# Patient Record
Sex: Male | Born: 1950 | Race: White | Hispanic: No | Marital: Married | Smoking: Never smoker
Health system: Southern US, Community
[De-identification: ages and names within clinical notes are randomized; demographics above are authoritative.]

## PROBLEM LIST (undated history)

## (undated) DIAGNOSIS — G4700 Insomnia, unspecified: Secondary | ICD-10-CM

## (undated) DIAGNOSIS — E119 Type 2 diabetes mellitus without complications: Secondary | ICD-10-CM

## (undated) HISTORY — PX: HERNIA REPAIR: SHX51

## (undated) HISTORY — DX: Type 2 diabetes mellitus without complications: E11.9

## (undated) HISTORY — DX: Insomnia, unspecified: G47.00

## (undated) HISTORY — PX: HAND SURGERY: SHX662

---

## 2005-08-26 ENCOUNTER — Encounter: Admission: RE | Admit: 2005-08-26 | Discharge: 2005-08-26 | Payer: Self-pay | Admitting: Orthopedic Surgery

## 2005-09-02 ENCOUNTER — Ambulatory Visit (HOSPITAL_COMMUNITY): Admission: RE | Admit: 2005-09-02 | Discharge: 2005-09-02 | Payer: Self-pay | Admitting: Orthopedic Surgery

## 2005-09-24 ENCOUNTER — Encounter: Admission: RE | Admit: 2005-09-24 | Discharge: 2005-09-24 | Payer: Self-pay | Admitting: Orthopedic Surgery

## 2007-12-03 ENCOUNTER — Emergency Department (HOSPITAL_COMMUNITY): Admission: EM | Admit: 2007-12-03 | Discharge: 2007-12-03 | Payer: Self-pay | Admitting: Family Medicine

## 2007-12-11 ENCOUNTER — Ambulatory Visit: Payer: Self-pay

## 2007-12-11 ENCOUNTER — Ambulatory Visit: Payer: Self-pay | Admitting: Cardiology

## 2010-04-05 ENCOUNTER — Encounter: Payer: Self-pay | Admitting: Orthopedic Surgery

## 2010-07-20 DIAGNOSIS — G4700 Insomnia, unspecified: Secondary | ICD-10-CM

## 2010-07-20 DIAGNOSIS — M47812 Spondylosis without myelopathy or radiculopathy, cervical region: Secondary | ICD-10-CM

## 2010-07-20 DIAGNOSIS — N529 Male erectile dysfunction, unspecified: Secondary | ICD-10-CM

## 2010-07-20 HISTORY — DX: Male erectile dysfunction, unspecified: N52.9

## 2010-07-20 HISTORY — DX: Insomnia, unspecified: G47.00

## 2010-07-20 HISTORY — DX: Spondylosis without myelopathy or radiculopathy, cervical region: M47.812

## 2010-07-28 NOTE — Procedures (Signed)
[facility]                              Ronald Cruz, Ronald Cruz                        MRN:          [id number]  DATE:12/11/2007                            DOB:          1950/06/18    REFERRING PHYSICIAN:  Charlesetta Shanks   REFERRING PHYSICIAN:  Dr. Charlesetta Shanks   INDICATION FOR STRESS TEST:  The patient with hypertension, diabetes,  and hypercholesterolemia who wants to increase his level of Ronald.  This is a screening test for any evidence of coronary artery disease.   Ronald TEST SUMMARY:  The patient exercised using standard Bruce  protocol for 10 minutes and 8 seconds.  He achieved a work level of  11.90 METs.  The resting heart rate of 88 beats per minute, rest with  maximal heart rate of 176 beats per minute.  This value represents 108%  of the maximal age predicted heart rate.  The resting blood pressure of  121/77 mmHg, rest with maximum blood pressure of 184/71 mmHg.  The  Ronald test was stopped due to fatigue.  There was no chest pain.  There was no dyspnea.  Ronald EKG showed nonspecific upsloping ST  depressions in leads V4 and V5.  There was one strip on which there was  about 1 mm of horizontal ST depression only in lead V4 and this was not  present on the following strip or on the strip before.  I think overall  this is a negative EKG treadmill test for ischemia.   INTERPRETATION:  There is no definite evidence for ischemia and  excellent Ronald capacity.  I would suggest continued aggressive risk  factor management with diabetes control, blood pressure control.  The  patient is to start taking aspirin 81 mg daily and should continue on  his cholesterol medication with a goal LDL less than 100.     Marca Ancona, MD  Electronically Signed    DM/MedQ  DD: 12/11/2007  DT: 12/12/2007  Job #: [id number]   cc:   Charlesetta Shanks

## 2011-08-18 DIAGNOSIS — E559 Vitamin D deficiency, unspecified: Secondary | ICD-10-CM | POA: Insufficient documentation

## 2011-08-18 HISTORY — DX: Vitamin D deficiency, unspecified: E55.9

## 2012-06-28 DIAGNOSIS — L247 Irritant contact dermatitis due to plants, except food: Secondary | ICD-10-CM

## 2012-06-28 HISTORY — DX: Irritant contact dermatitis due to plants, except food: L24.7

## 2012-08-19 DIAGNOSIS — I10 Essential (primary) hypertension: Secondary | ICD-10-CM

## 2012-08-19 DIAGNOSIS — J309 Allergic rhinitis, unspecified: Secondary | ICD-10-CM

## 2012-08-19 DIAGNOSIS — F32A Depression, unspecified: Secondary | ICD-10-CM

## 2012-08-19 DIAGNOSIS — F329 Major depressive disorder, single episode, unspecified: Secondary | ICD-10-CM

## 2012-08-19 HISTORY — DX: Depression, unspecified: F32.A

## 2012-08-19 HISTORY — DX: Major depressive disorder, single episode, unspecified: F32.9

## 2012-08-19 HISTORY — DX: Allergic rhinitis, unspecified: J30.9

## 2012-08-19 HISTORY — DX: Essential (primary) hypertension: I10

## 2013-05-02 DIAGNOSIS — B029 Zoster without complications: Secondary | ICD-10-CM

## 2013-05-02 DIAGNOSIS — E669 Obesity, unspecified: Secondary | ICD-10-CM | POA: Insufficient documentation

## 2013-05-02 DIAGNOSIS — E119 Type 2 diabetes mellitus without complications: Secondary | ICD-10-CM | POA: Insufficient documentation

## 2013-05-02 DIAGNOSIS — E782 Mixed hyperlipidemia: Secondary | ICD-10-CM

## 2013-05-02 DIAGNOSIS — Z135 Encounter for screening for eye and ear disorders: Secondary | ICD-10-CM

## 2013-05-02 HISTORY — DX: Obesity, unspecified: E66.9

## 2013-05-02 HISTORY — DX: Type 2 diabetes mellitus without complications: E11.9

## 2013-05-02 HISTORY — DX: Encounter for screening for eye and ear disorders: Z13.5

## 2013-05-02 HISTORY — DX: Zoster without complications: B02.9

## 2013-05-02 HISTORY — DX: Mixed hyperlipidemia: E78.2

## 2013-12-31 DIAGNOSIS — B351 Tinea unguium: Secondary | ICD-10-CM | POA: Insufficient documentation

## 2013-12-31 HISTORY — DX: Tinea unguium: B35.1

## 2014-09-25 DIAGNOSIS — M778 Other enthesopathies, not elsewhere classified: Secondary | ICD-10-CM

## 2014-09-25 HISTORY — DX: Other enthesopathies, not elsewhere classified: M77.8

## 2015-10-17 DIAGNOSIS — Z1211 Encounter for screening for malignant neoplasm of colon: Secondary | ICD-10-CM | POA: Insufficient documentation

## 2015-10-17 HISTORY — DX: Encounter for screening for malignant neoplasm of colon: Z12.11

## 2016-11-04 DIAGNOSIS — B354 Tinea corporis: Secondary | ICD-10-CM

## 2016-11-04 HISTORY — DX: Tinea corporis: B35.4

## 2017-03-30 DIAGNOSIS — M25511 Pain in right shoulder: Secondary | ICD-10-CM | POA: Insufficient documentation

## 2017-03-30 DIAGNOSIS — M1810 Unilateral primary osteoarthritis of first carpometacarpal joint, unspecified hand: Secondary | ICD-10-CM

## 2017-03-30 DIAGNOSIS — M25512 Pain in left shoulder: Secondary | ICD-10-CM | POA: Insufficient documentation

## 2017-03-30 DIAGNOSIS — M79642 Pain in left hand: Secondary | ICD-10-CM | POA: Insufficient documentation

## 2017-03-30 HISTORY — DX: Pain in left shoulder: M25.512

## 2017-03-30 HISTORY — DX: Pain in right shoulder: M25.511

## 2017-03-30 HISTORY — DX: Unilateral primary osteoarthritis of first carpometacarpal joint, unspecified hand: M18.10

## 2017-03-30 HISTORY — DX: Pain in left hand: M79.642

## 2017-11-23 DIAGNOSIS — M6530 Trigger finger, unspecified finger: Secondary | ICD-10-CM

## 2017-11-23 HISTORY — DX: Trigger finger, unspecified finger: M65.30

## 2019-04-16 ENCOUNTER — Ambulatory Visit: Payer: Self-pay

## 2019-05-04 ENCOUNTER — Ambulatory Visit: Payer: Self-pay

## 2019-05-06 ENCOUNTER — Ambulatory Visit: Payer: Medicare HMO | Attending: Internal Medicine

## 2019-05-06 DIAGNOSIS — Z23 Encounter for immunization: Secondary | ICD-10-CM | POA: Insufficient documentation

## 2019-05-06 NOTE — Progress Notes (Signed)
   Covid-19 Vaccination Clinic  Name:  Ronald Cruz    MRN: [id number] DOB: [birth year]  05/06/2019  Mr. Ronald Cruz was observed post Covid-19 immunization for 15 minutes without incidence. He was provided with Vaccine Information Sheet and instruction to access the V-Safe system.   Mr. Ronald Cruz was instructed to call 911 with any severe reactions post vaccine: Marland Kitchen Difficulty breathing  . Swelling of your face and throat  . A fast heartbeat  . A bad rash all over your body  . Dizziness and weakness    Immunizations Administered    Name Date Dose VIS Date Route   Pfizer COVID-19 Vaccine 05/06/2019  9:03 AM 0.3 mL 02/23/2019 Intramuscular   Manufacturer: [place]   Lot: [id number]   [place]: [id number]

## 2019-05-29 ENCOUNTER — Ambulatory Visit: Payer: Medicare HMO | Attending: Internal Medicine

## 2019-05-29 DIAGNOSIS — Z23 Encounter for immunization: Secondary | ICD-10-CM

## 2019-05-29 NOTE — Progress Notes (Signed)
   Covid-19 Vaccination Clinic  Name:  Ronald Cruz    MRN: [id number] DOB: 10-16-1950  05/29/2019  Ronald Cruz was observed post Covid-19 immunization for 15 minutes without incident. He was provided with Vaccine Information Sheet and instruction to access the V-Safe system.   Ronald Cruz was instructed to call 911 with any severe reactions post vaccine: Marland Kitchen Difficulty breathing  . Swelling of face and throat  . A fast heartbeat  . A bad rash all over body  . Dizziness and weakness   Immunizations Administered    Name Date Dose VIS Date Route   Pfizer COVID-19 Vaccine 05/29/2019  3:23 PM 0.3 mL 02/23/2019 Intramuscular   Manufacturer: [place]   Lot: [id number]   [place]: [id number]

## 2019-08-21 DIAGNOSIS — R413 Other amnesia: Secondary | ICD-10-CM | POA: Insufficient documentation

## 2019-08-21 HISTORY — DX: Other amnesia: R41.3

## 2019-08-28 DIAGNOSIS — R221 Localized swelling, mass and lump, neck: Secondary | ICD-10-CM

## 2019-08-28 DIAGNOSIS — M545 Low back pain, unspecified: Secondary | ICD-10-CM | POA: Insufficient documentation

## 2019-08-28 DIAGNOSIS — G8929 Other chronic pain: Secondary | ICD-10-CM

## 2019-08-28 HISTORY — DX: Low back pain, unspecified: M54.50

## 2019-08-28 HISTORY — DX: Localized swelling, mass and lump, neck: R22.1

## 2019-08-28 HISTORY — DX: Other chronic pain: G89.29

## 2019-10-15 DIAGNOSIS — K116 Mucocele of salivary gland: Secondary | ICD-10-CM

## 2019-10-15 HISTORY — DX: Mucocele of salivary gland: K11.6

## 2019-10-24 ENCOUNTER — Encounter: Payer: Self-pay | Admitting: Cardiology

## 2019-10-24 ENCOUNTER — Encounter: Payer: Self-pay | Admitting: *Deleted

## 2019-10-24 ENCOUNTER — Telehealth: Payer: Self-pay | Admitting: Cardiology

## 2019-10-24 ENCOUNTER — Other Ambulatory Visit: Payer: Self-pay

## 2019-10-24 ENCOUNTER — Ambulatory Visit: Payer: Medicare HMO | Admitting: Cardiology

## 2019-10-24 VITALS — BP 90/60 | HR 80 | Ht 69.5 in | Wt 207.0 lb

## 2019-10-24 DIAGNOSIS — E119 Type 2 diabetes mellitus without complications: Secondary | ICD-10-CM

## 2019-10-24 DIAGNOSIS — E785 Hyperlipidemia, unspecified: Secondary | ICD-10-CM

## 2019-10-24 DIAGNOSIS — I10 Essential (primary) hypertension: Secondary | ICD-10-CM | POA: Diagnosis not present

## 2019-10-24 DIAGNOSIS — I2510 Atherosclerotic heart disease of native coronary artery without angina pectoris: Secondary | ICD-10-CM

## 2019-10-24 DIAGNOSIS — E669 Obesity, unspecified: Secondary | ICD-10-CM

## 2019-10-24 HISTORY — DX: Obesity, unspecified: E66.9

## 2019-10-24 HISTORY — DX: Type 2 diabetes mellitus without complications: E11.9

## 2019-10-24 HISTORY — DX: Atherosclerotic heart disease of native coronary artery without angina pectoris: I25.10

## 2019-10-24 HISTORY — DX: Hyperlipidemia, unspecified: E78.5

## 2019-10-24 NOTE — Progress Notes (Signed)
Cardiology Office Note:    Date:  10/24/2019   ID:  Ronald Cruz, DOB June 22, 1950, MRN [id number]  PCP:  Bernerd Limbo, MD  Cardiologist:  No primary care provider on file.  Electrophysiologist:  None   Referring MD: Bernerd Limbo, MD   Chief Complaint  Patient presents with  . Hypertension    With diabetes   History of Present Illness:    Ronald Cruz is a 69 y.o. male with a hx of hypertension, hyperlipidemia, diabetes, history of subtle memory loss, presents today to be evaluated and establish cardiac care.  Patient does not have any specific cardiovascular complaints at this time.  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 In the office today he denies any chest pain, shortness of breath, nausea, vomiting, any dizziness or lightheadedness.   Past Medical History:  Diagnosis Date  . Acute pain of left shoulder 03/30/2017  . Allergic rhinitis 08/19/2012  . Cervical spondylosis 07/20/2010  . Chronic midline low back pain without sciatica 08/28/2019   Last Assessment & Plan:  Formatting of this note might be different from the original. This pain is chronic, but he does not take medication, nor have any radicular symptoms.  Because he can have multiple diagnostic imaging studies performed on the same day for a single co-pay, he desires MRI of the lumbar spine to be scheduled along with previously planned MRI of the brain.  MRI and plain films o  . Contact dermatitis and eczema due to plant 06/28/2012   Formatting of this note might be different from the original. Overview:  IMPRESSION: Topical itch creams advised also.  Steroid will affect blood sugar of next few days. Formatting of this note might be different from the  original. IMPRESSION: Topical itch creams advised also.  Steroid will affect blood sugar of next few days. Formatting of this note might be different from the original. Overview:  . Depressive disorder 08/19/2012  . 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 Overview:  IMPRESSION: Pt's Levitra has not worked. We will change to Viagra. Welbutrin usually does not affect erection. It could be more a result of his DM Formatting of this note might be different from the original. IMPRESSION: Pt's Levitra has not worked. We will change to Viagra. Welbutrin usually does not affect erection. It coul  . Essential hypertension 08/19/2012  . Herpes zoster 05/02/2013   Formatting of this note might be different from the original. Overview:  STORY: rash since sunday, post 72 hours window for antiviral therapy. `E1o3L`pain described is consistant with shingles, the rash is not vesicular, but has been scratching areas.`E1o3L`IMPRESSION: pain bearable. will watch and start neurontin if needed Formatting of this note might be different from the original. STORY: rash   . Lump in neck 08/28/2019   Formatting of this note might be different from the original. RIGHT, just posterior to the angle of the mandible.  Nontender.  Soft and mobile.  Last Assessment & Plan:  Formatting of this note might be different from the original. Right, just posterior to the angle of the mandible.  Nontender.  Soft and mobile.  This is  not concerning for neoplasm.  . Memory changes 08/21/2019  . Mixed hyperlipidemia 05/02/2013   Formatting of this note might be different from the original. Overview:  IMPRESSION: The goal is to have your total cholesterol < 200, the HDL (good cholesterol) >40, and the LDL (bad cholesterol) <100.`E1o3L`It is recomended that you follow a good low fat diet and exercise for 30 minutes 3-4 times a week. Formatting of this note might be different from the  original. IMPRESSION: The goal is to hav  . Obesity 05/02/2013  . Onychomycosis 12/31/2013  . 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 Pain of left hand 03/30/2017  . Pain of right shoulder joint on movement 03/30/2017  . 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 Tendinitis of left shoulder 09/25/2014  . Tinea corporis 11/04/2016  . Trigger finger 11/23/2017  . Type II diabetes mellitus ([place]) 05/02/2013   Formatting of this note might be different from the original. Overview:  IMPRESSION: The goal is for the Hgb A1C to be less than 7.0.`E1o3L`It is recommended that all diatetics are educated on and follow a healthy diabetic diet, exercise for 30 minutes 3-4 times per week (walking, biking, swimming, or machine), monitor blood glucose readings and bring that record with you to be reviewed at your ne  . Vitamin D deficiency 08/18/2011   Formatting of this note might be different from the original. Overview:  IMPRESSION: patient taking Vitamin D 2000IU per day. His levels are normal. Continue same dose Formatting of this note might be different from the original. IMPRESSION: patient taking Vitamin D 2000IU per day. His levels are normal. Continue same dose Formatting of this note might be different from the original. Overview:  Ov    Past Surgical History:  Procedure Laterality Date  . HAND SURGERY Right    Thumb  . HERNIA REPAIR      Current Medications: Current Meds  Medication Sig  . amLODipine (NORVASC) 10 MG tablet Take 10 mg by mouth daily.  . Ascorbic Acid (VITAMIN C) 1000 MG tablet Take 1,000 mg by mouth daily.  Marland Kitchen aspirin EC 81 MG tablet Take 81 mg by mouth daily. Swallow whole.  . benazepril (LOTENSIN) 20 MG tablet Take 20 mg by mouth daily.  . clotrimazole-betamethasone (LOTRISONE) cream Apply 1 application topically 2 (two) times daily.  . cyanocobalamin 1000 MCG tablet Take 1 tablet by mouth in the morning and at bedtime.  . Empagliflozin-linaGLIPtin (GLYXAMBI) 25-5 MG  TABS Take 1 tablet by mouth daily.  Marland Kitchen glipiZIDE (GLUCOTROL XL) 10 MG 24 hr tablet Take 10 mg by mouth in the morning and at bedtime.  . hydrochlorothiazide (HYDRODIURIL) 25 MG tablet Take 25 mg by mouth daily.  . metFORMIN (GLUCOPHAGE) 1000 MG tablet Take 1,000 mg by mouth 2 (two) times daily with a meal.  . simvastatin (ZOCOR) 40 MG tablet Take 40 mg by mouth daily.  . tadalafil (CIALIS) 5 MG tablet Take 5 mg by mouth daily as needed for erectile dysfunction.  . traZODone (DESYREL) 50 MG tablet Take 50 mg by mouth at bedtime.  . Vitamin D, Ergocalciferol, 50 MCG (2000 UT) CAPS Take by mouth daily.  . vitamin E 45 MG (100 UNITS) capsule Take 1 capsule by mouth daily.  . WHEAT GERM OIL PO Take 1 tablet by mouth in the morning and at bedtime.     Allergies:   Patient has no known allergies.   Social History   Socioeconomic History  . Marital status: Married  Spouse name: Not on file  . Number of children: Not on file  . Years of education: Not on file  . Highest education level: Not on file  Occupational History  . Not on file  Tobacco Use  . Smoking status: Never Smoker  . Smokeless tobacco: Never Used  Substance and Sexual Activity  . Alcohol use: Yes    Comment: beer once or twice a month  . Drug use: Never  . Sexual activity: Not on file  Other Topics Concern  . Not on file  Social History Narrative  . Not on file   Social Determinants of Health   Financial Resource Strain:   . Difficulty of Paying Living Expenses:   Food Insecurity:   . Worried About Charity fundraiser in the Last Year:   . Arboriculturist in the Last Year:   Transportation Needs:   . Film/video editor (Medical):   Marland Kitchen Lack of Transportation (Non-Medical):   Physical Activity:   . Days of Exercise per Week:   . Minutes of Exercise per Session:   Stress:   . Feeling of Stress :   Social Connections:   . Frequency of Communication with Friends and Family:   . Frequency of Social Gatherings  with Friends and Family:   . Attends Religious Services:   . Active Member of Clubs or Organizations:   . Attends Archivist Meetings:   Marland Kitchen Marital Status:      Family History: The patient's family history includes Cancer in his paternal grandfather; Esophageal cancer in his mother; Stroke in his father.  ROS:   Review of Systems  Constitution: Negative for decreased appetite, fever and weight gain.  HENT: Negative for congestion, ear discharge, hoarse voice and sore throat.   Eyes: Negative for discharge, redness, vision loss in right eye and visual halos.  Cardiovascular: Negative for chest pain, dyspnea on exertion, leg swelling, orthopnea and palpitations.  Respiratory: Negative for cough, hemoptysis, shortness of breath and snoring.   Endocrine: Negative for heat intolerance and polyphagia.  Hematologic/Lymphatic: Negative for bleeding problem. Does not bruise/bleed easily.  Skin: Negative for flushing, nail changes, rash and suspicious lesions.  Musculoskeletal: Negative for arthritis, joint pain, muscle cramps, myalgias, neck pain and stiffness.  Gastrointestinal: Negative for abdominal pain, bowel incontinence, diarrhea and excessive appetite.  Genitourinary: Negative for decreased libido, genital sores and incomplete emptying.  Neurological: Negative for brief paralysis, focal weakness, headaches and loss of balance.  Psychiatric/Behavioral: Negative for altered mental status, depression and suicidal ideas.  Allergic/Immunologic: Negative for HIV exposure and persistent infections.    EKGs/Labs/Other Studies Reviewed:    The following studies were reviewed today:   EKG:  The ekg ordered today demonstrates sinus rhythm, heart rate 83 bpm  Recent Labs: No results found for requested labs within last 8760 hours.  Recent Lipid Panel Lipid profile on June 25, 2019 done at [facility] shows total cholesterol 124, triglyceride 108, HDL 47, LDL 57.  Hemoglobin A1c  7.4  Physical Exam:    VS:  BP 90/60 (BP Location: Right Arm, Patient Position: Sitting, Cuff Size: Normal)   Pulse 80   Ht 5' 9.5" (1.765 m)   Wt 207 lb (93.9 kg)   SpO2 97%   BMI 30.13 kg/m     Wt Readings from Last 3 Encounters:  10/24/19 207 lb (93.9 kg)     GEN: Well nourished, well developed in no acute distress HEENT: Normal NECK: No JVD; No  carotid bruits LYMPHATICS: No lymphadenopathy CARDIAC: S1S2 noted,RRR, no murmurs, rubs, gallops RESPIRATORY:  Clear to auscultation without rales, wheezing or rhonchi  ABDOMEN: Soft, non-tender, non-distended, +bowel sounds, no guarding. EXTREMITIES: No edema, No cyanosis, no clubbing MUSCULOSKELETAL:  No deformity  SKIN: Warm and dry NEUROLOGIC:  Alert and oriented x 3, non-focal PSYCHIATRIC:  Normal affect, good insight  ASSESSMENT:    1. Essential hypertension   2. Hyperlipidemia, unspecified hyperlipidemia type   3. Type 2 diabetes mellitus without complication, without long-term current use of insulin (HCC)   4. Obesity (BMI 30-39.9)   5. ASCVD (arteriosclerotic cardiovascular disease)    PLAN:     His blood pressure susceptible in the office today no changes will be made to his antihypertensive medication.  Manually done by me right 104/68, left 106/64 mmHg.  I was able to review his lipid profile done on June 25, 2019 at [facility] at this time no changes will be made to his current dose of simvastatin.  Diabetes mellitus continue patient on his current medication per his PCP.  His 10-year is ASCVD score is 13.1%.  At this time he would benefit from a coronary calcium scoring CT scan.  The patient is interested in getting this testing done.  We have discussed indication and how this testing can help with understanding his cardiovascular health.  All of his questions were answered.  I am going to order the coronary CTA.  Blood work will be done for BMP and mag to understand his electrolytes and kidney  function.  The patient is in agreement with the above plan. The patient left the office in stable condition.  The patient will follow up in 6 months or sooner if needed.   Medication Adjustments/Labs and Tests Ordered: Current medicines are reviewed at length with the patient today.  Concerns regarding medicines are outlined above.  Orders Placed This Encounter  Procedures  . CT CARDIAC SCORING  . Basic metabolic panel  . Magnesium  . EKG 12-Lead  . ECHOCARDIOGRAM COMPLETE  . ECHOCARDIOGRAM COMPLETE   No orders of the defined types were placed in this encounter.   Patient Instructions  Medication Instructions:  Your physician recommends that you continue on your current medications as directed. Please refer to the Current Medication list given to you today.  *If you need a refill on your cardiac medications before your next appointment, please call your pharmacy*   Lab Work: Your physician recommends that you return for lab work in: [place] If you have labs (blood work) drawn today and your tests are completely normal, you will receive your results only by: Marland Kitchen MyChart Message (if you have MyChart) OR . A paper copy in the mail If you have any lab test that is abnormal or we need to change your treatment, we will call you to review the results.   Testing/Procedures: Your physician has requested that you have an echocardiogram. Echocardiography is a painless test that uses sound waves to create images of your heart. It provides your doctor with information about the size and shape of your heart and how well your heart's chambers and valves are working. This procedure takes approximately one hour. There are no restrictions for this procedure.  We have put in an order for you to have a cardiac calcium score completed. They will call you to schedule this appointment.   Follow-Up: At [facility], you and your health needs are our priority.  As part of our continuing  mission to provide you with exceptional heart care, we have created designated Provider Care Teams.  These Care Teams include your primary Cardiologist (physician) and Advanced Practice Providers (APPs -  Physician Assistants and Nurse Practitioners) who all work together to provide you with the care you need, when you need it.  We recommend signing up for the patient portal called "MyChart".  Sign up information is provided on this After Visit Summary.  MyChart is used to connect with patients for Virtual Visits (Telemedicine).  Patients are able to view lab/test results, encounter notes, upcoming appointments, etc.  Non-urgent messages can be sent to your provider as well.   To learn more about what you can do with MyChart, go to [email or web address].    Your next appointment:   6 month(s)  The format for your next appointment:   In Person  Provider:   Berniece Salines, DO   Other Instructions      Adopting a Healthy Lifestyle.  Know what a healthy weight is for you (roughly BMI <25) and aim to maintain this   Aim for 7+ servings of fruits and vegetables daily   65-80+ fluid ounces of water or unsweet tea for healthy kidneys   Limit to max 1 drink of alcohol per day; avoid smoking/tobacco   Limit animal fats in diet for cholesterol and heart health - choose grass fed whenever available   Avoid highly processed foods, and foods high in saturated/trans fats   Aim for low stress - take time to unwind and care for your mental health   Aim for 150 min of moderate intensity exercise weekly for heart health, and weights twice weekly for bone health   Aim for 7-9 hours of sleep daily   When it comes to diets, agreement about the perfect plan isnt easy to find, even among the experts. Experts at the [employer] developed an idea known as the Healthy Eating Plate. Just imagine a plate divided into logical, healthy portions.   The emphasis is on diet quality:    Load up on vegetables and fruits - one-half of your plate: Aim for color and variety, and remember that potatoes dont count.   Go for whole grains - one-quarter of your plate: Whole wheat, barley, wheat berries, quinoa, oats, brown rice, and foods made with them. If you want pasta, go with whole wheat pasta.   Protein power - one-quarter of your plate: Fish, chicken, beans, and nuts are all healthy, versatile protein sources. Limit red meat.   The diet, however, does go beyond the plate, offering a few other suggestions.   Use healthy plant oils, such as olive, canola, soy, corn, sunflower and peanut. Check the labels, and avoid partially hydrogenated oil, which have unhealthy trans fats.   If youre thirsty, drink water. Coffee and tea are good in moderation, but skip sugary drinks and limit milk and dairy products to one or two daily servings.   The type of carbohydrate in the diet is more important than the amount. Some sources of carbohydrates, such as vegetables, fruits, whole grains, and beans-are healthier than others.   Finally, stay active  Signed, Berniece Salines, DO  10/24/2019 12:41 PM    [facility]

## 2019-10-24 NOTE — Patient Instructions (Signed)
Medication Instructions:  Your physician recommends that you continue on your current medications as directed. Please refer to the Current Medication list given to you today.  *If you need a refill on your cardiac medications before your next appointment, please call your pharmacy*   Lab Work: Your physician recommends that you return for lab work in: [place], [place] If you have labs (blood work) drawn today and your tests are completely normal, you will receive your results only by:  [place] (if you have MyChart) OR  A paper copy in the mail If you have any lab test that is abnormal or we need to change your treatment, we will call you to review the results.   Testing/Procedures: Your physician has requested that you have an echocardiogram. Echocardiography is a painless test that uses sound waves to create images of your heart. It provides your doctor with information about the size and shape of your heart and how well your hearts chambers and valves are working. This procedure takes approximately one hour. There are no restrictions for this procedure.  We have put in an order for you to have a cardiac calcium score completed. They will call you to schedule this appointment.   Follow-Up: At [facility], you and your health needs are our priority.  As part of our continuing mission to provide you with exceptional heart care, we have created designated Provider Care Teams.  These Care Teams include your primary Cardiologist (physician) and Advanced Practice Providers (APPs -  Physician Assistants and Nurse Practitioners) who all work together to provide you with the care you need, when you need it.  We recommend signing up for the patient portal called "MyChart".  Sign up information is provided on this After Visit Summary.  MyChart is used to connect with patients for Virtual Visits (Telemedicine).  Patients are able to view lab/test results, encounter notes, upcoming  appointments, etc.  Non-urgent messages can be sent to your provider as well.   To learn more about what you can do with MyChart, go to [email or web address].    Your next appointment:   6 month(s)  The format for your next appointment:   In Person  Provider:   Berniece Salines, DO   Other Instructions

## 2019-10-24 NOTE — Telephone Encounter (Signed)
Patient called stating he was returning a call. I informed him I did not see any notes of anyone calling. He thanked me for the help.

## 2019-10-25 LAB — BASIC METABOLIC PANEL
BUN/Creatinine Ratio: 19 (ref 10–24)
BUN: 22 mg/dL (ref 8–27)
CO2: 28 mmol/L (ref 20–29)
Calcium: 9.5 mg/dL (ref 8.6–10.2)
Chloride: 100 mmol/L (ref 96–106)
Creatinine, Ser: 1.16 mg/dL (ref 0.76–1.27)
GFR calc Af Amer: 73 mL/min/{1.73_m2} (ref >59)
GFR calc non Af Amer: 63 mL/min/{1.73_m2} (ref >59)
Glucose: 202 mg/dL — ABNORMAL HIGH (ref 65–99)
Potassium: 4.4 mmol/L (ref 3.5–5.2)
Sodium: 143 mmol/L (ref 134–144)

## 2019-10-25 LAB — MAGNESIUM: Magnesium: 2 mg/dL (ref 1.6–2.3)

## 2019-10-25 NOTE — Telephone Encounter (Signed)
Patient is returning call to discuss results from lab work completed on 10/24/19.

## 2019-10-25 NOTE — Telephone Encounter (Signed)
Pt aware of lab results ./cy 

## 2019-11-06 ENCOUNTER — Other Ambulatory Visit (HOSPITAL_COMMUNITY): Payer: Medicare HMO

## 2019-11-06 ENCOUNTER — Other Ambulatory Visit: Payer: Medicare HMO

## 2019-11-27 ENCOUNTER — Other Ambulatory Visit: Payer: Self-pay

## 2019-11-27 ENCOUNTER — Ambulatory Visit (INDEPENDENT_AMBULATORY_CARE_PROVIDER_SITE_OTHER)
Admission: RE | Admit: 2019-11-27 | Discharge: 2019-11-27 | Disposition: A | Discharge status: Home / Self Care | Payer: Self-pay | Source: Ambulatory Visit | Attending: Cardiology | Admitting: Cardiology

## 2019-11-27 ENCOUNTER — Ambulatory Visit (HOSPITAL_COMMUNITY): Payer: Medicare HMO | Attending: Cardiovascular Disease

## 2019-11-27 DIAGNOSIS — I10 Essential (primary) hypertension: Secondary | ICD-10-CM | POA: Diagnosis present

## 2019-11-27 DIAGNOSIS — E785 Hyperlipidemia, unspecified: Secondary | ICD-10-CM | POA: Diagnosis not present

## 2019-11-27 LAB — ECHOCARDIOGRAM COMPLETE
Area-P 1/2: 2.8 cm2
S' Lateral: 3.1 cm

## 2019-11-27 MED ORDER — PERFLUTREN LIPID MICROSPHERE
1.0000 mL | INTRAVENOUS | Status: AC | PRN
  Administered 2019-11-27: 2 mL via INTRAVENOUS

## 2019-11-28 ENCOUNTER — Telehealth: Payer: Self-pay

## 2019-11-28 MED ORDER — ROSUVASTATIN CALCIUM 20 MG PO TABS: 20.0000 mg | ORAL_TABLET | Freq: Every day | ORAL | 3 refills | Status: AC

## 2019-11-28 NOTE — Telephone Encounter (Signed)
-----   Message from Berniece Salines, DO sent at 11/28/2019  1:01 PM EDT ----- Your calcium score is elevated 1920.  This is highly significant.  Like to change you from simvastatin to Crestor 20 mg daily.  In addition you would benefit from getting a coronary CTA giving the severity of calcium in your heart.  I will be happy to see you in follow-up to discuss more details and educate you more on the testing

## 2019-11-28 NOTE — Telephone Encounter (Signed)
Spoke with patient regarding results and recommendation.  Patient verbalizes understanding and is agreeable to plan of care. Advised patient to call back with any issues or concerns.  

## 2019-11-30 ENCOUNTER — Other Ambulatory Visit: Payer: Self-pay

## 2019-12-03 ENCOUNTER — Other Ambulatory Visit: Payer: Self-pay

## 2019-12-03 ENCOUNTER — Ambulatory Visit: Payer: Medicare HMO | Admitting: Cardiology

## 2019-12-03 ENCOUNTER — Encounter: Payer: Self-pay | Admitting: Cardiology

## 2019-12-03 VITALS — BP 112/72 | HR 66 | Ht 69.5 in | Wt 201.0 lb

## 2019-12-03 DIAGNOSIS — I2584 Coronary atherosclerosis due to calcified coronary lesion: Secondary | ICD-10-CM

## 2019-12-03 DIAGNOSIS — E669 Obesity, unspecified: Secondary | ICD-10-CM | POA: Diagnosis not present

## 2019-12-03 DIAGNOSIS — I10 Essential (primary) hypertension: Secondary | ICD-10-CM | POA: Diagnosis not present

## 2019-12-03 DIAGNOSIS — I2510 Atherosclerotic heart disease of native coronary artery without angina pectoris: Secondary | ICD-10-CM

## 2019-12-03 DIAGNOSIS — E119 Type 2 diabetes mellitus without complications: Secondary | ICD-10-CM

## 2019-12-03 DIAGNOSIS — E782 Mixed hyperlipidemia: Secondary | ICD-10-CM

## 2019-12-03 MED ORDER — FUROSEMIDE 20 MG PO TABS: ORAL_TABLET | ORAL | 2 refills | Status: DC

## 2019-12-03 MED ORDER — METOPROLOL TARTRATE 100 MG PO TABS: 100.0000 mg | ORAL_TABLET | Freq: Once | ORAL | 0 refills | Status: DC

## 2019-12-03 MED ORDER — POTASSIUM CHLORIDE CRYS ER 20 MEQ PO TBCR: EXTENDED_RELEASE_TABLET | ORAL | 3 refills | Status: DC

## 2019-12-03 NOTE — Progress Notes (Signed)
Cardiology Office Note:    Date:  12/03/2019   ID:  Ronald Cruz, DOB August 21, 1950, MRN [id number]  PCP:  Bernerd Limbo, MD  Cardiologist:  Berniece Salines, DO  Electrophysiologist:  None   Referring MD: Bernerd Limbo, MD   Chief Complaint  Patient presents with  . Follow-up    History of Present Illness:    Ronald Cruz is a 69 y.o. male with a hx of hypertension, hyperlipidemia, diabetes and subtle memory loss presented for cardiovascular evaluation on October 24, 2019.  At that time based on his risk factor calculated his ASCVD score at 13% making the patient a good candidate for for coronary calcium score.  In the interim the patient was able to get his coronary calcium scoring done he is here today to discuss these results.   Past Medical History:  Diagnosis Date  . Acute pain of left shoulder 03/30/2017  . Allergic rhinitis 08/19/2012  . ASCVD (arteriosclerotic cardiovascular disease) 10/24/2019  . Cervical spondylosis 07/20/2010  . Chronic midline low back pain without sciatica 08/28/2019   Last Assessment & Plan:  Formatting of this note might be different from the original. This pain is chronic, but he does not take medication, nor have any radicular symptoms.  Because he can have multiple diagnostic imaging studies performed on the same day for a single co-pay, he desires MRI of the lumbar spine to be scheduled along with previously planned MRI of the brain.  MRI and plain films o  . Contact dermatitis and eczema due to plant 06/28/2012   Formatting of this note might be different from the original. Overview:  IMPRESSION: Topical itch creams advised also.  Steroid will affect blood sugar of next few days. Formatting of this note might be different from the original. IMPRESSION: Topical itch creams advised also.  Steroid will affect blood sugar of next few days. Formatting of this note might be different from the original. Overview:  . Depressive disorder 08/19/2012  . ED (erectile  dysfunction) of organic origin 07/20/2010   Formatting of this note might be different from the original. Overview:  IMPRESSION: Pt's Levitra has not worked. We will change to Viagra. Welbutrin usually does not affect erection. It could be more a result of his DM Formatting of this note might be different from the original. IMPRESSION: Pt's Levitra has not worked. We will change to Viagra. Welbutrin usually does not affect erection. It coul  . Essential hypertension 08/19/2012  . Herpes zoster 05/02/2013   Formatting of this note might be different from the original. Overview:  STORY: rash since sunday, post 72 hours window for antiviral therapy. `E1o3L`pain described is consistant with shingles, the rash is not vesicular, but has been scratching areas.`E1o3L`IMPRESSION: pain bearable. will watch and start neurontin if needed Formatting of this note might be different from the original. STORY: rash   . Hyperlipidemia 10/24/2019  . Lump in neck 08/28/2019   Formatting of this note might be different from the original. RIGHT, just posterior to the angle of the mandible.  Nontender.  Soft and mobile.  Last Assessment & Plan:  Formatting of this note might be different from the original. Right, just posterior to the angle of the mandible.  Nontender.  Soft and mobile.  This is not concerning for neoplasm.  . Memory changes 08/21/2019  . Mixed hyperlipidemia 05/02/2013   Formatting of this note might be different from the original. Overview:  IMPRESSION: The goal is to have your total cholesterol < 200, the  HDL (good cholesterol) >40, and the LDL (bad cholesterol) <100.`E1o3L`It is recomended that you follow a good low fat diet and exercise for 30 minutes 3-4 times a week. Formatting of this note might be different from the original. IMPRESSION: The goal is to hav  . Obesity 05/02/2013  . Obesity (BMI 30-39.9) 10/24/2019  . Onychomycosis 12/31/2013  . 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 Pain of left hand 03/30/2017    . Pain of right shoulder joint on movement 03/30/2017  . 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 Screening for colon cancer 10/17/2015  . Screening for eye condition 05/02/2013  . Tendinitis of left shoulder 09/25/2014  . Tinea corporis 11/04/2016  . Trigger finger 11/23/2017  . Type 2 diabetes mellitus without complication, without long-term current use of insulin ([place]) 10/24/2019  . Type II diabetes mellitus ([place]) 05/02/2013   Formatting of this note might be different from the original. Overview:  IMPRESSION: The goal is for the Hgb A1C to be less than 7.0.`E1o3L`It is recommended that all diatetics are educated on and follow a healthy diabetic diet, exercise for 30 minutes 3-4 times per week (walking, biking, swimming, or machine), monitor blood glucose readings and bring that record with you to be reviewed at your ne  . Vitamin D deficiency 08/18/2011   Formatting of this note might be different from the original. Overview:  IMPRESSION: patient taking Vitamin D 2000IU per day. His levels are normal. Continue same dose Formatting of this note might be different from the original. IMPRESSION: patient taking Vitamin D 2000IU per day. His levels are normal. Continue same dose Formatting of this note might be different from the original. Overview:  Ov    Past Surgical History:  Procedure Laterality Date  . HAND SURGERY Right    Thumb  . HERNIA REPAIR      Current Medications: Current Meds  Medication Sig  . amLODipine (NORVASC) 10 MG tablet Take 5 mg by mouth daily.  . Ascorbic Acid (VITAMIN C) 1000 MG tablet Take 1,000 mg by mouth daily.  Marland Kitchen aspirin EC 81 MG tablet Take 81 mg by mouth daily. Swallow whole.  . benazepril (LOTENSIN) 20 MG tablet Take 20 mg by mouth daily.  . clotrimazole-betamethasone (LOTRISONE) cream Apply 1 application topically 2 (two) times daily.  . cyanocobalamin 1000 MCG tablet Take 1 tablet by mouth in the morning and at bedtime.  Marland Kitchen glipiZIDE (GLUCOTROL  XL) 10 MG 24 hr tablet Take 10 mg by mouth in the morning and at bedtime.  . hydrochlorothiazide (HYDRODIURIL) 25 MG tablet Take 25 mg by mouth daily.  . metFORMIN (GLUCOPHAGE) 1000 MG tablet Take 1,000 mg by mouth 2 (two) times daily with a meal.  . pioglitazone (ACTOS) 30 MG tablet Take 30 mg by mouth daily.  . rosuvastatin (CRESTOR) 20 MG tablet Take 1 tablet (20 mg total) by mouth daily.  . tadalafil (CIALIS) 5 MG tablet Take 5 mg by mouth daily as needed for erectile dysfunction.  . traZODone (DESYREL) 50 MG tablet Take 50 mg by mouth at bedtime.  . Vitamin D, Ergocalciferol, 50 MCG (2000 UT) CAPS Take by mouth daily.  . vitamin E 45 MG (100 UNITS) capsule Take 1 capsule by mouth daily.  . WHEAT GERM OIL PO Take 1 tablet by mouth in the morning and at bedtime.     Allergies:   Patient has no known allergies.   Social History   Socioeconomic History  . Marital status: Married  Spouse name: Not on file  . Number of children: Not on file  . Years of education: Not on file  . Highest education level: Not on file  Occupational History  . Not on file  Tobacco Use  . Smoking status: Never Smoker  . Smokeless tobacco: Never Used  Substance and Sexual Activity  . Alcohol use: Yes    Comment: beer once or twice a month  . Drug use: Never  . Sexual activity: Not on file  Other Topics Concern  . Not on file  Social History Narrative  . Not on file   Social Determinants of Health   Financial Resource Strain:   . Difficulty of Paying Living Expenses: Not on file  Food Insecurity:   . Worried About Charity fundraiser in the Last Year: Not on file  . Ran Out of Food in the Last Year: Not on file  Transportation Needs:   . Lack of Transportation (Medical): Not on file  . Lack of Transportation (Non-Medical): Not on file  Physical Activity:   . Days of Exercise per Week: Not on file  . Minutes of Exercise per Session: Not on file  Stress:   . Feeling of Stress : Not on file    Social Connections:   . Frequency of Communication with Friends and Family: Not on file  . Frequency of Social Gatherings with Friends and Family: Not on file  . Attends Religious Services: Not on file  . Active Member of Clubs or Organizations: Not on file  . Attends Archivist Meetings: Not on file  . Marital Status: Not on file     Family History: The patient's family history includes Cancer in his paternal grandfather; Esophageal cancer in his mother; Stroke in his father.  ROS:   Review of Systems  Constitution: Negative for decreased appetite, fever and weight gain.  HENT: Negative for congestion, ear discharge, hoarse voice and sore throat.   Eyes: Negative for discharge, redness, vision loss in right eye and visual halos.  Cardiovascular: Negative for chest pain, dyspnea on exertion, leg swelling, orthopnea and palpitations.  Respiratory: Negative for cough, hemoptysis, shortness of breath and snoring.   Endocrine: Negative for heat intolerance and polyphagia.  Hematologic/Lymphatic: Negative for bleeding problem. Does not bruise/bleed easily.  Skin: Negative for flushing, nail changes, rash and suspicious lesions.  Musculoskeletal: Negative for arthritis, joint pain, muscle cramps, myalgias, neck pain and stiffness.  Gastrointestinal: Negative for abdominal pain, bowel incontinence, diarrhea and excessive appetite.  Genitourinary: Negative for decreased libido, genital sores and incomplete emptying.  Neurological: Negative for brief paralysis, focal weakness, headaches and loss of balance.  Psychiatric/Behavioral: Negative for altered mental status, depression and suicidal ideas.  Allergic/Immunologic: Negative for HIV exposure and persistent infections.    EKGs/Labs/Other Studies Reviewed:    The following studies were reviewed today:   EKG: None today  Coronary calcium scoring IMPRESSION: Coronary calcium score of 1920. This was 14 percentile for age  and sex matched control.  Echocardiogram  1. Left ventricular ejection fraction, by estimation, is 55 to 60%. The  left ventricle has normal function. The left ventricle has no regional  wall motion abnormalities. Left ventricular diastolic parameters are  consistent with Grade I diastolic  dysfunction (impaired relaxation).  2. Right ventricular systolic function is normal. The right ventricular  size is normal. Tricuspid regurgitation signal is inadequate for assessing  PA pressure.  3. The mitral valve is grossly normal. Trivial mitral valve  regurgitation.  No evidence of mitral stenosis.  4. The aortic valve is tricuspid. Aortic valve regurgitation is not  visualized. No aortic stenosis is present.  5. The inferior vena cava is normal in size with greater than 50%  respiratory variability, suggesting right atrial pressure of 3 mmHg.   Conclusion(s)/Recommendation(s): Normal biventricular function without  evidence of hemodynamically significant valvular heart disease.   Recent Labs: 10/24/2019: BUN 22; Creatinine, Ser 1.16; Magnesium 2.0; Potassium 4.4; Sodium 143  Recent Lipid Panel No results found for: CHOL, TRIG, HDL, CHOLHDL, VLDL, LDLCALC, LDLDIRECT  Physical Exam:    VS:  BP 112/72 (BP Location: Right Arm, Patient Position: Sitting, Cuff Size: Normal)   Pulse 66   Ht 5' 9.5" (1.765 m)   Wt 221 lb (100.2 kg)   SpO2 98%   BMI 32.17 kg/m     Wt Readings from Last 3 Encounters:  12/03/19 221 lb (100.2 kg)  10/24/19 207 lb (93.9 kg)     GEN: Well nourished, well developed in no acute distress HEENT: Normal NECK: No JVD; No carotid bruits LYMPHATICS: No lymphadenopathy CARDIAC: S1S2 noted,RRR, no murmurs, rubs, gallops RESPIRATORY:  Clear to auscultation without rales, wheezing or rhonchi  ABDOMEN: Soft, non-tender, non-distended, +bowel sounds, no guarding. EXTREMITIES: +1 bilateral leg edema, No cyanosis, no clubbing MUSCULOSKELETAL:  No deformity  SKIN:  Warm and dry NEUROLOGIC:  Alert and oriented x 3, non-focal PSYCHIATRIC:  Normal affect, good insight  ASSESSMENT:    1. ASCVD (arteriosclerotic cardiovascular disease)   2. Coronary artery disease due to calcified coronary lesion   3. Essential hypertension   4. Type 2 diabetes mellitus without complication, without long-term current use of insulin ([facility])   5. Obesity (BMI 30-39.9)   6. Mixed hyperlipidemia    PLAN:      His coronary calcium score was 1920 which showed significant burden for coronary artery disease.  Given his abnormal testing will like to proceed with a coronary CTA to get a good understanding of his coronary artery disease.  He is really upset in the office today because he tells me he is done everything in his power to prevent poor cardiovascular health.  I was able to explain to the patient that he has risk factors and diabetes as well as his hyperlipidemia which causes propensity for increased burning for coronary artery disease. He has no IV contrast dye allergy he is agreeable to proceed with a coronary CTA.  All of his questions has been answered in the office.  We also discussed the results of his echocardiogram.  I did switch the patient from simvastatin to a more potent statin he is now on Crestor 20 mg a day.  He also take aspirin 81 mg.  He does have bilateral leg edema in the office today which is worsened from the last time I saw him.  He is on amlodipine 10 mg for me to cut this back to 5 mg daily as well as placed him on Lasix 20 mg Tuesday Thursday and Saturdays with potassium supplement.  The patient understands the need to lose weight with diet and exercise. We have discussed specific strategies for this.  The patient is in agreement with the above plan. The patient left the office in stable condition.  The patient will follow up in 3 months or sooner if needed.   Medication Adjustments/Labs and Tests Ordered: Current medicines are reviewed at  length with the patient today.  Concerns regarding medicines are outlined above.  No orders of  the defined types were placed in this encounter.  No orders of the defined types were placed in this encounter.   There are no Patient Instructions on file for this visit.   Adopting a Healthy Lifestyle.  Know what a healthy weight is for you (roughly BMI <25) and aim to maintain this   Aim for 7+ servings of fruits and vegetables daily   65-80+ fluid ounces of water or unsweet tea for healthy kidneys   Limit to max 1 drink of alcohol per day; avoid smoking/tobacco   Limit animal fats in diet for cholesterol and heart health - choose grass fed whenever available   Avoid highly processed foods, and foods high in saturated/trans fats   Aim for low stress - take time to unwind and care for your mental health   Aim for 150 min of moderate intensity exercise weekly for heart health, and weights twice weekly for bone health   Aim for 7-9 hours of sleep daily   When it comes to diets, agreement about the perfect plan isnt easy to find, even among the experts. Experts at the [employer] developed an idea known as the Healthy Eating Plate. Just imagine a plate divided into logical, healthy portions.   The emphasis is on diet quality:   Load up on vegetables and fruits - one-half of your plate: Aim for color and variety, and remember that potatoes dont count.   Go for whole grains - one-quarter of your plate: Whole wheat, barley, wheat berries, quinoa, oats, brown rice, and foods made with them. If you want pasta, go with whole wheat pasta.   Protein power - one-quarter of your plate: Fish, chicken, beans, and nuts are all healthy, versatile protein sources. Limit red meat.   The diet, however, does go beyond the plate, offering a few other suggestions.   Use healthy plant oils, such as olive, canola, soy, corn, sunflower and peanut. Check the labels, and avoid partially  hydrogenated oil, which have unhealthy trans fats.   If youre thirsty, drink water. Coffee and tea are good in moderation, but skip sugary drinks and limit milk and dairy products to one or two daily servings.   The type of carbohydrate in the diet is more important than the amount. Some sources of carbohydrates, such as vegetables, fruits, whole grains, and beans-are healthier than others.   Finally, stay active  Signed, Berniece Salines, DO  12/03/2019 10:43 AM    [place]

## 2019-12-03 NOTE — Patient Instructions (Addendum)
Medication Instructions:  Your physician has recommended you make the following change in your medication:   Take 20 mg Lasix on Tuesday, Thursday and Saturdays. Take 20 mg KCL on Tuesday, Thursday and Saturdays. Decrease Amlodipine to 5 mg daily.  *If you need a refill on your cardiac medications before your next appointment, please call your pharmacy*   Lab Work: None ordered If you have labs (blood work) drawn today and your tests are completely normal, you will receive your results only by: Marland Kitchen MyChart Message (if you have MyChart) OR . A paper copy in the mail If you have any lab test that is abnormal or we need to change your treatment, we will call you to review the results.   Testing/Procedures: Your cardiac CT will be scheduled at:   [facility] [place],  [address] [phone]   If scheduled at [facility], please arrive at the [facility] main entrance of [employer] 30 minutes prior to test start time. Proceed to the [facility] Radiology Department (first floor) to check-in and test prep.  Please follow these instructions carefully (unless otherwise directed):  Hold all erectile dysfunction medications at least 3 days (72 hrs) prior to test.  On the Night Before the Test: . Be sure to Drink plenty of water. . Do not consume any caffeinated/decaffeinated beverages or chocolate 12 hours prior to your test. . Do not take any antihistamines 12 hours prior to your test.   On the Day of the Test: . Drink plenty of water. Do not drink any water within one hour of the test. . Do not eat any food 4 hours prior to the test. . You may take your regular medications prior to the test.  . Take metoprolol (Lopressor) two hours prior to test. . HOLD Furosemide/Hydrochlorothiazide morning of the test.        After the Test: . Drink plenty of water. . After receiving IV contrast, you may experience a mild flushed feeling.  This is normal. . On occasion, you may experience a mild rash up to 24 hours after the test. This is not dangerous. If this occurs, you can take Benadryl 25 mg and increase your fluid intake. . If you experience trouble breathing, this can be serious. If it is severe call 911 IMMEDIATELY. If it is mild, please call our office. . If you take any of these medications: Glipizide/Metformin, Avandament, Glucavance, please do not take 48 hours after completing test unless otherwise instructed.   Once we have confirmed authorization from your insurance company, we will call you to set up a date and time for your test. Based on how quickly your insurance processes prior authorizations requests, please allow up to 4 weeks to be contacted for scheduling your Cardiac CT appointment. Be advised that routine Cardiac CT appointments could be scheduled as many as 8 weeks after your provider has ordered it.  For non-scheduling related questions, please contact the cardiac imaging nurse navigator should you have any questions/concerns: [name], Cardiac Imaging Nurse Navigator Burley Saver, Interim Cardiac Imaging Nurse [place] and [facility] Services Direct Office Dial: [phone]   For scheduling needs, including cancellations and rescheduling, please call Vivien Rota at [phone].     Follow-Up: At [facility], you and your health needs are our priority.  As part of our continuing mission to provide you with exceptional heart care, we have created designated Provider Care Teams.  These Care Teams include  your primary Cardiologist (physician) and Advanced Practice Providers (APPs -  Physician Assistants and Nurse Practitioners) who all work together to provide you with the care you need, when you need it.  We recommend signing up for the patient portal called "MyChart".  Sign up information is provided on this After Visit Summary.  MyChart is used to connect with patients for Virtual Visits  (Telemedicine).  Patients are able to view lab/test results, encounter notes, upcoming appointments, etc.  Non-urgent messages can be sent to your provider as well.   To learn more about what you can do with MyChart, go to [email or web address].    Your next appointment:   3 month(s)  The format for your next appointment:   In Person  Provider:   Berniece Salines, DO   Other Instructions Cardiac CT Angiogram A cardiac CT angiogram is a procedure to look at the heart and the area around the heart. It may be done to help find the cause of chest pains or other symptoms of heart disease. During this procedure, a substance called contrast dye is injected into the blood vessels in the area to be checked. A large X-ray machine, called a CT scanner, then takes detailed pictures of the heart and the surrounding area. The procedure is also sometimes called a coronary CT angiogram, coronary artery scanning, or CTA. A cardiac CT angiogram allows the health care provider to see how well blood is flowing to and from the heart. The health care provider will be able to see if there are any problems, such as:  Blockage or narrowing of the coronary arteries in the heart.  Fluid around the heart.  Signs of weakness or disease in the muscles, valves, and tissues of the heart. Tell a health care provider about:  Any allergies you have. This is especially important if you have had a previous allergic reaction to contrast dye.  All medicines you are taking, including vitamins, herbs, eye drops, creams, and over-the-counter medicines.  Any blood disorders you have.  Any surgeries you have had.  Any medical conditions you have.  Whether you are pregnant or may be pregnant.  Any anxiety disorders, chronic pain, or other conditions you have that may increase your stress or prevent you from lying still. What are the risks? Generally, this is a safe procedure. However, problems may occur,  including: 1. Bleeding. 2. Infection. 3. Allergic reactions to medicines or dyes. 4. Damage to other structures or organs. 5. Kidney damage from the contrast dye that is used. 6. Increased risk of cancer from radiation exposure. This risk is low. Talk with your health care provider about: ? The risks and benefits of testing. ? How you can receive the lowest dose of radiation. What happens before the procedure? 1. Wear comfortable clothing and remove any jewelry, glasses, dentures, and hearing aids. 2. Follow instructions from your health care provider about eating and drinking. This may include: ? For 12 hours before the procedure -- avoid caffeine. This includes tea, coffee, soda, energy drinks, and diet pills. Drink plenty of water or other fluids that do not have caffeine in them. Being well hydrated can prevent complications. ? For 4-6 hours before the procedure -- stop eating and drinking. The contrast dye can cause nausea, but this is less likely if your stomach is empty. 3. Ask your health care provider about changing or stopping your regular medicines. This is especially important if you are taking diabetes medicines, blood thinners, or medicines to treat problems  with erections (erectile dysfunction). What happens during the procedure?  1. Hair on your chest may need to be removed so that small sticky patches called electrodes can be placed on your chest. These will transmit information that helps to monitor your heart during the procedure. 2. An IV will be inserted into one of your veins. 3. You might be given a medicine to control your heart rate during the procedure. This will help to ensure that good images are obtained. 4. You will be asked to lie on an exam table. This table will slide in and out of the CT machine during the procedure. 5. Contrast dye will be injected into the IV. You might feel warm, or you may get a metallic taste in your mouth. 6. You will be given a medicine  called nitroglycerin. This will relax or dilate the arteries in your heart. 7. The table that you are lying on will move into the CT machine tunnel for the scan. 8. The person running the machine will give you instructions while the scans are being done. You may be asked to: ? Keep your arms above your head. ? Hold your breath. ? Stay very still, even if the table is moving. 9. When the scanning is complete, you will be moved out of the machine. 10. The IV will be removed. The procedure may vary among health care providers and hospitals. What can I expect after the procedure? After your procedure, it is common to have:  A metallic taste in your mouth from the contrast dye.  A feeling of warmth.  A headache from the nitroglycerin. Follow these instructions at home:  Take over-the-counter and prescription medicines only as told by your health care provider.  If you are told, drink enough fluid to keep your urine pale yellow. This will help to flush the contrast dye out of your body.  Most people can return to their normal activities right after the procedure. Ask your health care provider what activities are safe for you.  It is up to you to get the results of your procedure. Ask your health care provider, or the department that is doing the procedure, when your results will be ready.  Keep all follow-up visits as told by your health care provider. This is important. Contact a health care provider if: 1. You have any symptoms of allergy to the contrast dye. These include: ? Shortness of breath. ? Rash or hives. ? A racing heartbeat. Summary  A cardiac CT angiogram is a procedure to look at the heart and the area around the heart. It may be done to help find the cause of chest pains or other symptoms of heart disease.  During this procedure, a large X-ray machine, called a CT scanner, takes detailed pictures of the heart and the surrounding area after a contrast dye has been  injected into blood vessels in the area.  Ask your health care provider about changing or stopping your regular medicines before the procedure. This is especially important if you are taking diabetes medicines, blood thinners, or medicines to treat erectile dysfunction.  If you are told, drink enough fluid to keep your urine pale yellow. This will help to flush the contrast dye out of your body. This information is not intended to replace advice given to you by your health care provider. Make sure you discuss any questions you have with your health care provider. Document Revised: 10/25/2018 Document Reviewed: 10/25/2018 Elsevier Patient Education  [place].

## 2019-12-13 ENCOUNTER — Telehealth: Payer: Self-pay

## 2019-12-13 ENCOUNTER — Other Ambulatory Visit: Payer: Self-pay

## 2019-12-13 DIAGNOSIS — I2510 Atherosclerotic heart disease of native coronary artery without angina pectoris: Secondary | ICD-10-CM

## 2019-12-13 DIAGNOSIS — I2584 Coronary atherosclerosis due to calcified coronary lesion: Secondary | ICD-10-CM

## 2019-12-13 DIAGNOSIS — E119 Type 2 diabetes mellitus without complications: Secondary | ICD-10-CM

## 2019-12-13 DIAGNOSIS — I10 Essential (primary) hypertension: Secondary | ICD-10-CM

## 2019-12-13 NOTE — Addendum Note (Signed)
Addended by: Truddie Hidden on: 12/13/2019 09:57 AM   Modules accepted: Orders

## 2019-12-13 NOTE — Telephone Encounter (Signed)
-----   Message from Roosvelt Maser sent at 12/07/2019  2:30 PM EDT ----- Regarding: ct heart   Patient scheduled 10/5 @ 9:15am.  He will needs labs  Thanks, Vivien Rota

## 2019-12-13 NOTE — Telephone Encounter (Signed)
Message left for pt to have labs done in the office within the week prior to his CT scan. Order in epic.

## 2019-12-17 ENCOUNTER — Telehealth (HOSPITAL_COMMUNITY): Payer: Self-pay | Admitting: Emergency Medicine

## 2019-12-17 NOTE — Telephone Encounter (Signed)
Reaching out to patient to offer assistance regarding upcoming cardiac imaging study; pt verbalizes understanding of appt date/time, parking situation and where to check in, pre-test NPO status and medications ordered, and verified current allergies; name and call back number provided for further questions should they arise [name] RN Navigator Cardiac Imaging [place] and Vascular [phone] office [phone] cell   Pt asked NOT to take BP medications (amlodipine/benazepril) or diuretics (lasix/HCTZ) prior to scan. Also holding cialis  Pt taking metoprolol 2 hr prior to scan  Holding diabetes medication 2 days post scan Ronald Cruz

## 2019-12-18 ENCOUNTER — Ambulatory Visit (HOSPITAL_COMMUNITY)
Admission: RE | Admit: 2019-12-18 | Discharge: 2019-12-18 | Disposition: A | Discharge status: Home / Self Care | Payer: Medicare HMO | Source: Ambulatory Visit | Attending: Cardiology | Admitting: Cardiology

## 2019-12-18 ENCOUNTER — Encounter (HOSPITAL_COMMUNITY): Payer: Self-pay

## 2019-12-18 ENCOUNTER — Telehealth: Payer: Self-pay | Admitting: Cardiology

## 2019-12-18 DIAGNOSIS — E119 Type 2 diabetes mellitus without complications: Secondary | ICD-10-CM | POA: Insufficient documentation

## 2019-12-18 DIAGNOSIS — I2510 Atherosclerotic heart disease of native coronary artery without angina pectoris: Secondary | ICD-10-CM | POA: Diagnosis not present

## 2019-12-18 DIAGNOSIS — I2584 Coronary atherosclerosis due to calcified coronary lesion: Secondary | ICD-10-CM | POA: Diagnosis present

## 2019-12-18 LAB — POCT I-STAT CREATININE: Creatinine, Ser: 2 mg/dL — ABNORMAL HIGH (ref 0.61–1.24)

## 2019-12-18 MED ORDER — NITROGLYCERIN 0.4 MG SL SUBL
SUBLINGUAL_TABLET | SUBLINGUAL | Status: AC
  Filled 2019-12-18: qty 1

## 2019-12-18 MED ORDER — SODIUM CHLORIDE 0.9 % IV BOLUS
250.0000 mL | Freq: Once | INTRAVENOUS | Status: AC
  Administered 2019-12-18: 250 mL via INTRAVENOUS

## 2019-12-18 MED ORDER — NITROGLYCERIN 0.4 MG SL SUBL
0.8000 mg | SUBLINGUAL_TABLET | Freq: Once | SUBLINGUAL | Status: AC
  Administered 2019-12-18: 0.4 mg via SUBLINGUAL

## 2019-12-18 MED ORDER — SODIUM CHLORIDE 0.9 % IV BOLUS
500.0000 mL | Freq: Once | INTRAVENOUS | Status: AC
  Administered 2019-12-18: 500 mL via INTRAVENOUS

## 2019-12-18 NOTE — Telephone Encounter (Signed)
I did get the message died due to hypotension his CT scan was rescheduled.  It would be best that he follows up with me so we can go through all of his medications so I have enough time to discuss in more detail with him.

## 2019-12-18 NOTE — Telephone Encounter (Signed)
Left message for patient to return call.

## 2019-12-18 NOTE — Progress Notes (Signed)
CT scan cancelled due to low blood pressure. Will notify Dr. Harriet Masson for new orders to reschedule. Pt D/C home ambulatory, awake and alert. In no distress

## 2019-12-18 NOTE — Telephone Encounter (Signed)
Neew Message:    Pt called, he went to have his CT at [facility] today and they could not do it. He said he would like for Dr Harriet Masson to give him a call today please. He said he stopped some of his medicine and wants to discuss this with her.   He also wanted to made sur his weight had been corrected  From his 12-03-19 appt with Dr Harriet Masson. He said they had recorded 221 and it should have been 211. He said he had called before, just wanted to make sure it was changed.

## 2019-12-19 NOTE — Telephone Encounter (Signed)
Patient scheduled for appointment tomorrow with Dr. Harriet Masson.

## 2019-12-20 ENCOUNTER — Encounter: Payer: Self-pay | Admitting: Cardiology

## 2019-12-20 ENCOUNTER — Other Ambulatory Visit: Payer: Self-pay

## 2019-12-20 ENCOUNTER — Ambulatory Visit: Payer: Medicare HMO | Admitting: Cardiology

## 2019-12-20 VITALS — BP 102/63 | HR 80 | Ht 69.5 in | Wt 204.6 lb

## 2019-12-20 DIAGNOSIS — I2510 Atherosclerotic heart disease of native coronary artery without angina pectoris: Secondary | ICD-10-CM

## 2019-12-20 DIAGNOSIS — E782 Mixed hyperlipidemia: Secondary | ICD-10-CM

## 2019-12-20 DIAGNOSIS — E1100 Type 2 diabetes mellitus with hyperosmolarity without nonketotic hyperglycemic-hyperosmolar coma (NKHHC): Secondary | ICD-10-CM | POA: Diagnosis not present

## 2019-12-20 DIAGNOSIS — I2584 Coronary atherosclerosis due to calcified coronary lesion: Secondary | ICD-10-CM

## 2019-12-20 DIAGNOSIS — I10 Essential (primary) hypertension: Secondary | ICD-10-CM | POA: Diagnosis not present

## 2019-12-20 MED ORDER — AMLODIPINE BESYLATE 5 MG PO TABS: 5.0000 mg | ORAL_TABLET | Freq: Every day | ORAL | 3 refills | Status: DC

## 2019-12-20 MED ORDER — METOPROLOL TARTRATE 100 MG PO TABS: 100.0000 mg | ORAL_TABLET | Freq: Once | ORAL | 0 refills | Status: DC

## 2019-12-20 NOTE — Patient Instructions (Addendum)
Medication Instructions:  Your physician has recommended you make the following change in your medication:  STOP: Lasix and Potassium Decrease: Amlodipine 5 mg take one tablet by mouth daily.  *If you need a refill on your cardiac medications before your next appointment, please call your pharmacy*   Lab Work: Your physician recommends that you return for lab work in: [place] If you have labs (blood work) drawn today and your tests are completely normal, you will receive your results only by: Marland Kitchen MyChart Message (if you have MyChart) OR . A paper copy in the mail If you have any lab test that is abnormal or we need to change your treatment, we will call you to review the results.   Testing/Procedures: None   Follow-Up: At [facility], you and your health needs are our priority.  As part of our continuing mission to provide you with exceptional heart care, we have created designated Provider Care Teams.  These Care Teams include your primary Cardiologist (physician) and Advanced Practice Providers (APPs -  Physician Assistants and Nurse Practitioners) who all work together to provide you with the care you need, when you need it.  We recommend signing up for the patient portal called "MyChart".  Sign up information is provided on this After Visit Summary.  MyChart is used to connect with patients for Virtual Visits (Telemedicine).  Patients are able to view lab/test results, encounter notes, upcoming appointments, etc.  Non-urgent messages can be sent to your provider as well.   To learn more about what you can do with MyChart, go to [email or web address].    Your next appointment:    02/28/2020 at 9:40 AM  The format for your next appointment:   In Person  Provider:   Berniece Salines, DO   Other Instructions

## 2019-12-20 NOTE — Progress Notes (Signed)
Cardiology Office Note:    Date:  12/20/2019   ID:  Ronald Cruz, DOB [birth year], MRN [id number]  PCP:  Bernerd Limbo, MD  Cardiologist:  Berniece Salines, DO  Electrophysiologist:  None   Referring MD: Bernerd Limbo, MD   " I am doing fine"   History of Present Illness:    Ronald Cruz is a 69 y.o. male with a hx of  hypertension, hyperlipidemia, diabetes and subtle memory loss and subtle memory loss comes today for follow-up visit.  Last saw the patient on December 03, 2019 at that time we discussed his results from his coronary calcium scoring.  During his last encounter we discussed planning for coronary CTA.  As well as he did have some bilateral leg edema I placed the patient on Lasix 20 mg Tuesday Thursday and Saturdays.  I switch his statin to high intensity Crestor.  Unfortunately the patient was not able to get his coronary CTA done due to hypotension and which limited the potential use of nitroglycerin. He is here today for follow-up visit.   Past Medical History:  Diagnosis Date  . Acute pain of left shoulder 03/30/2017  . Allergic rhinitis 08/19/2012  . ASCVD (arteriosclerotic cardiovascular disease) 10/24/2019  . Cervical spondylosis 07/20/2010  . Chronic midline low back pain without sciatica 08/28/2019   Last Assessment & Plan:  Formatting of this note might be different from the original. This pain is chronic, but he does not take medication, nor have any radicular symptoms.  Because he can have multiple diagnostic imaging studies performed on the same day for a single co-pay, he desires MRI of the lumbar spine to be scheduled along with previously planned MRI of the brain.  MRI and plain films o  . Contact dermatitis and eczema due to plant 06/28/2012   Formatting of this note might be different from the original. Overview:  IMPRESSION: Topical itch creams advised also.  Steroid will affect blood sugar of next few days. Formatting of this note might be different from the  original. IMPRESSION: Topical itch creams advised also.  Steroid will affect blood sugar of next few days. Formatting of this note might be different from the original. Overview:  . Depressive disorder 08/19/2012  . 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 Overview:  IMPRESSION: Pt's Levitra has not worked. We will change to Viagra. Welbutrin usually does not affect erection. It could be more a result of his DM Formatting of this note might be different from the original. IMPRESSION: Pt's Levitra has not worked. We will change to Viagra. Welbutrin usually does not affect erection. It coul  . Essential hypertension 08/19/2012  . Herpes zoster 05/02/2013   Formatting of this note might be different from the original. Overview:  STORY: rash since sunday, post 72 hours window for antiviral therapy. `E1o3L`pain described is consistant with shingles, the rash is not vesicular, but has been scratching areas.`E1o3L`IMPRESSION: pain bearable. will watch and start neurontin if needed Formatting of this note might be different from the original. STORY: rash   . Hyperlipidemia 10/24/2019  . Lump in neck 08/28/2019   Formatting of this note might be different from the original. RIGHT, just posterior to the angle of the mandible.  Nontender.  Soft and mobile.  Last Assessment & Plan:  Formatting of this note might be different from the original. Right, just posterior to the angle of the mandible.  Nontender.  Soft and mobile.  This is not concerning for neoplasm.  . Memory changes 08/21/2019  . Mixed hyperlipidemia 05/02/2013   Formatting of this note might be different from the original. Overview:  IMPRESSION: The goal is to have your total cholesterol < 200, the HDL (good cholesterol) >40, and the LDL (bad cholesterol) <100.`E1o3L`It is recomended that you follow a good low fat diet and exercise for 30 minutes 3-4 times a week. Formatting of this note  might be different from the original. IMPRESSION: The goal is to hav  . Obesity 05/02/2013  . Obesity (BMI 30-39.9) 10/24/2019  . Onychomycosis 12/31/2013  . 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 Pain of left hand 03/30/2017  . Pain of right shoulder joint on movement 03/30/2017  . 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 Screening for colon cancer 10/17/2015  . Screening for eye condition 05/02/2013  . Tendinitis of left shoulder 09/25/2014  . Tinea corporis 11/04/2016  . Trigger finger 11/23/2017  . Type 2 diabetes mellitus without complication, without long-term current use of insulin ([place]) 10/24/2019  . Type II diabetes mellitus ([place]) 05/02/2013   Formatting of this note might be different from the original. Overview:  IMPRESSION: The goal is for the Hgb A1C to be less than 7.0.`E1o3L`It is recommended that all diatetics are educated on and follow a healthy diabetic diet, exercise for 30 minutes 3-4 times per week (walking, biking, swimming, or machine), monitor blood glucose readings and bring that record with you to be reviewed at your ne  . Vitamin D deficiency 08/18/2011   Formatting of this note might be different from the original. Overview:  IMPRESSION: patient taking Vitamin D 2000IU per day. His levels are normal. Continue same dose Formatting of this note might be different from the original. IMPRESSION: patient taking Vitamin D 2000IU per day. His levels are normal. Continue same dose Formatting of this note might be different from the original. Overview:  Ov    Past Surgical History:  Procedure Laterality Date  . HAND SURGERY Right    Thumb  . HERNIA REPAIR      Current Medications: Current Meds  Medication Sig  . Ascorbic Acid (VITAMIN C) 1000 MG tablet Take 1,000 mg by mouth daily.  Marland Kitchen aspirin EC 81 MG tablet Take 81 mg by mouth daily. Swallow whole.  . benazepril (LOTENSIN) 20 MG tablet Take 20 mg by mouth daily.  . clotrimazole-betamethasone (LOTRISONE) cream  Apply 1 application topically 2 (two) times daily.  . cyanocobalamin 1000 MCG tablet Take 1 tablet by mouth in the morning and at bedtime.  . Empagliflozin-linaGLIPtin (GLYXAMBI) 25-5 MG TABS Take 1 tablet by mouth daily.  Marland Kitchen glipiZIDE (GLUCOTROL XL) 10 MG 24 hr tablet Take 10 mg by mouth in the morning and at bedtime.  . hydrochlorothiazide (HYDRODIURIL) 25 MG tablet Take 25 mg by mouth daily.  . metFORMIN (GLUCOPHAGE) 1000 MG tablet Take 1,000 mg by mouth 2 (two) times daily with a meal.  . rosuvastatin (CRESTOR) 20 MG tablet Take 1 tablet (20 mg total) by mouth daily.  . tadalafil (CIALIS) 5 MG tablet Take 5 mg by mouth daily as needed for erectile dysfunction.  . traZODone (DESYREL) 50 MG tablet Take 50 mg by mouth at bedtime.  . Vitamin D, Ergocalciferol, 50 MCG (2000 UT) CAPS Take by mouth daily.  . vitamin E 45 MG (100 UNITS) capsule Take 1 capsule by mouth daily.  . WHEAT GERM OIL PO Take 1 tablet by mouth in the morning and  at bedtime.  . [DISCONTINUED] amLODipine (NORVASC) 10 MG tablet Take 5 mg by mouth daily.  . [DISCONTINUED] furosemide (LASIX) 20 MG tablet Take 20 mg on Tuesday, Thursday and Saturday.  . [DISCONTINUED] potassium chloride SA (KLOR-CON) 20 MEQ tablet Take 20 mg on Tuesday, Thursday and Saturday.     Allergies:   Patient has no known allergies.   Social History   Socioeconomic History  . Marital status: Married    Spouse name: Not on file  . Number of children: Not on file  . Years of education: Not on file  . Highest education level: Not on file  Occupational History  . Not on file  Tobacco Use  . Smoking status: Never Smoker  . Smokeless tobacco: Never Used  Substance and Sexual Activity  . Alcohol use: Yes    Comment: beer once or twice a month  . Drug use: Never  . Sexual activity: Not on file  Other Topics Concern  . Not on file  Social History Narrative  . Not on file   Social Determinants of Health   Financial Resource Strain:   .  Difficulty of Paying Living Expenses: Not on file  Food Insecurity:   . Worried About Charity fundraiser in the Last Year: Not on file  . Ran Out of Food in the Last Year: Not on file  Transportation Needs:   . Lack of Transportation (Medical): Not on file  . Lack of Transportation (Non-Medical): Not on file  Physical Activity:   . Days of Exercise per Week: Not on file  . Minutes of Exercise per Session: Not on file  Stress:   . Feeling of Stress : Not on file  Social Connections:   . Frequency of Communication with Friends and Family: Not on file  . Frequency of Social Gatherings with Friends and Family: Not on file  . Attends Religious Services: Not on file  . Active Member of Clubs or Organizations: Not on file  . Attends Archivist Meetings: Not on file  . Marital Status: Not on file     Family History: The patient's family history includes Cancer in his paternal grandfather; Esophageal cancer in his mother; Stroke in his father.  ROS:   Review of Systems  Constitution: Negative for decreased appetite, fever and weight gain.  HENT: Negative for congestion, ear discharge, hoarse voice and sore throat.   Eyes: Negative for discharge, redness, vision loss in right eye and visual halos.  Cardiovascular: Negative for chest pain, dyspnea on exertion, leg swelling, orthopnea and palpitations.  Respiratory: Negative for cough, hemoptysis, shortness of breath and snoring.   Endocrine: Negative for heat intolerance and polyphagia.  Hematologic/Lymphatic: Negative for bleeding problem. Does not bruise/bleed easily.  Skin: Negative for flushing, nail changes, rash and suspicious lesions.  Musculoskeletal: Negative for arthritis, joint pain, muscle cramps, myalgias, neck pain and stiffness.  Gastrointestinal: Negative for abdominal pain, bowel incontinence, diarrhea and excessive appetite.  Genitourinary: Negative for decreased libido, genital sores and incomplete emptying.    Neurological: Negative for brief paralysis, focal weakness, headaches and loss of balance.  Psychiatric/Behavioral: Negative for altered mental status, depression and suicidal ideas.  Allergic/Immunologic: Negative for HIV exposure and persistent infections.    EKGs/Labs/Other Studies Reviewed:    The following studies were reviewed today:   EKG:  None today   Coronary calcium scoring  IMPRESSION: Coronary calcium score of 1920. This was 36 percentile for age and sex matched control.  Foy Vanduyne,DO  Recent Labs: 10/24/2019: BUN 22; Magnesium 2.0; Potassium 4.4; Sodium 143 12/18/2019: Creatinine, Ser 2.00  Recent Lipid Panel No results found for: CHOL, TRIG, HDL, CHOLHDL, VLDL, LDLCALC, LDLDIRECT  Physical Exam:    VS:  BP 102/63   Pulse 80   Ht 5' 9.5" (1.765 m)   Wt 204 lb 9.6 oz (92.8 kg)   SpO2 95%   BMI 29.78 kg/m     Wt Readings from Last 3 Encounters:  12/20/19 204 lb 9.6 oz (92.8 kg)  12/03/19 201 lb (91.2 kg)  10/24/19 207 lb (93.9 kg)     GEN: Well nourished, well developed in no acute distress HEENT: Normal NECK: No JVD; No carotid bruits LYMPHATICS: No lymphadenopathy CARDIAC: S1S2 noted,RRR, no murmurs, rubs, gallops RESPIRATORY:  Clear to auscultation without rales, wheezing or rhonchi  ABDOMEN: Soft, non-tender, non-distended, +bowel sounds, no guarding. EXTREMITIES: No edema, No cyanosis, no clubbing MUSCULOSKELETAL:  No deformity  SKIN: Warm and dry NEUROLOGIC:  Alert and oriented x 3, non-focal PSYCHIATRIC:  Normal affect, good insight  ASSESSMENT:    1. Essential hypertension   2. Coronary artery disease due to calcified coronary lesion   3. Type 2 diabetes mellitus with hyperosmolarity without coma, without long-term current use of insulin ([place])   4. Mixed hyperlipidemia    PLAN:     He appears to be doing well clinically his rescheduled coronary CT is for 02/25/2020.  We will stop his Lasix at this time as his leg edema has resolved  completely.  I have advised the patient that he will need to hold his antihypertensive the day of his procedure.  We will be ordering beta-blocker prior to procedure that he will be able to take.  The goal is to make sure the patient blood pressure hold steady with systolic above 967 to be able to get his nitroglycerin.  Continue aspirin and statin.  His diabetes is being managed by his endocrinologist.   The patient is in agreement with the above plan. The patient left the office in stable condition.  The patient will follow up as scheduled.   Medication Adjustments/Labs and Tests Ordered: Current medicines are reviewed at length with the patient today.  Concerns regarding medicines are outlined above.  Orders Placed This Encounter  Procedures  . Basic metabolic panel  . Magnesium   Meds ordered this encounter  Medications  . amLODipine (NORVASC) 5 MG tablet    Sig: Take 1 tablet (5 mg total) by mouth daily.    Dispense:  180 tablet    Refill:  3    Patient Instructions  Medication Instructions:  Your physician has recommended you make the following change in your medication:  STOP: Lasix and Potassium Decrease: Amlodipine 5 mg take one tablet by mouth daily.  *If you need a refill on your cardiac medications before your next appointment, please call your pharmacy*   Lab Work: Your physician recommends that you return for lab work in: [place] If you have labs (blood work) drawn today and your tests are completely normal, you will receive your results only by: Marland Kitchen MyChart Message (if you have MyChart) OR . A paper copy in the mail If you have any lab test that is abnormal or we need to change your treatment, we will call you to review the results.   Testing/Procedures: None   Follow-Up: At [facility], you and your health needs are our priority.  As part of our continuing mission to provide you with  exceptional heart care, we have created designated Provider  Care Teams.  These Care Teams include your primary Cardiologist (physician) and Advanced Practice Providers (APPs -  Physician Assistants and Nurse Practitioners) who all work together to provide you with the care you need, when you need it.  We recommend signing up for the patient portal called "MyChart".  Sign up information is provided on this After Visit Summary.  MyChart is used to connect with patients for Virtual Visits (Telemedicine).  Patients are able to view lab/test results, encounter notes, upcoming appointments, etc.  Non-urgent messages can be sent to your provider as well.   To learn more about what you can do with MyChart, go to [email or web address].    Your next appointment:    02/28/2020 at 9:40 AM  The format for your next appointment:   In Person  Provider:   Berniece Salines, DO   Other Instructions      Adopting a Healthy Lifestyle.  Know what a healthy weight is for you (roughly BMI <25) and aim to maintain this   Aim for 7+ servings of fruits and vegetables daily   65-80+ fluid ounces of water or unsweet tea for healthy kidneys   Limit to max 1 drink of alcohol per day; avoid smoking/tobacco   Limit animal fats in diet for cholesterol and heart health - choose grass fed whenever available   Avoid highly processed foods, and foods high in saturated/trans fats   Aim for low stress - take time to unwind and care for your mental health   Aim for 150 min of moderate intensity exercise weekly for heart health, and weights twice weekly for bone health   Aim for 7-9 hours of sleep daily   When it comes to diets, agreement about the perfect plan isnt easy to find, even among the experts. Experts at the [place] developed an idea known as the Healthy Eating Plate. Just imagine a plate divided into logical, healthy portions.   The emphasis is on diet quality:   Load up on vegetables and fruits - one-half of your plate: Aim for color  and variety, and remember that potatoes dont count.   Go for whole grains - one-quarter of your plate: Whole wheat, barley, wheat berries, quinoa, oats, brown rice, and foods made with them. If you want pasta, go with whole wheat pasta.   Protein power - one-quarter of your plate: Fish, chicken, beans, and nuts are all healthy, versatile protein sources. Limit red meat.   The diet, however, does go beyond the plate, offering a few other suggestions.   Use healthy plant oils, such as olive, canola, soy, corn, sunflower and peanut. Check the labels, and avoid partially hydrogenated oil, which have unhealthy trans fats.   If youre thirsty, drink water. Coffee and tea are good in moderation, but skip sugary drinks and limit milk and dairy products to one or two daily servings.   The type of carbohydrate in the diet is more important than the amount. Some sources of carbohydrates, such as vegetables, fruits, whole grains, and beans-are healthier than others.   Finally, stay active  Signed, Berniece Salines, DO  12/20/2019 3:24 PM    [facility]

## 2019-12-20 NOTE — Addendum Note (Signed)
Addended by: Resa Miner I on: 12/20/2019 03:39 PM   Modules accepted: Orders

## 2019-12-21 ENCOUNTER — Telehealth: Payer: Self-pay | Admitting: *Deleted

## 2019-12-21 LAB — BASIC METABOLIC PANEL
BUN/Creatinine Ratio: 19 (ref 10–24)
BUN: 30 mg/dL — ABNORMAL HIGH (ref 8–27)
CO2: 25 mmol/L (ref 20–29)
Calcium: 9.4 mg/dL (ref 8.6–10.2)
Chloride: 99 mmol/L (ref 96–106)
Creatinine, Ser: 1.56 mg/dL — ABNORMAL HIGH (ref 0.76–1.27)
GFR calc Af Amer: 51 mL/min/{1.73_m2} — ABNORMAL LOW (ref >59)
GFR calc non Af Amer: 44 mL/min/{1.73_m2} — ABNORMAL LOW (ref >59)
Glucose: 133 mg/dL — ABNORMAL HIGH (ref 65–99)
Potassium: 4.1 mmol/L (ref 3.5–5.2)
Sodium: 141 mmol/L (ref 134–144)

## 2019-12-21 LAB — MAGNESIUM: Magnesium: 1.7 mg/dL (ref 1.6–2.3)

## 2019-12-21 NOTE — Telephone Encounter (Signed)
-----   Message from Berniece Salines, DO sent at 12/21/2019 12:24 PM EDT ----- Creatinine is coming back down.  1.56 continue to hydrate please

## 2019-12-21 NOTE — Telephone Encounter (Signed)
Pt voiced understanding

## 2019-12-24 ENCOUNTER — Telehealth (HOSPITAL_COMMUNITY): Payer: Self-pay | Admitting: Emergency Medicine

## 2019-12-24 NOTE — Telephone Encounter (Signed)
Reaching out to patient to offer assistance regarding upcoming cardiac imaging study; pt verbalizes understanding of appt date/time, parking situation and where to check in, pre-test NPO status and medications ordered, and verified current allergies; name and call back number provided for further questions should they arise [name] RN Navigator [facility] [place] [facility] [phone] office [phone] cell   Pt instructed to hold diuretics and daily BP meds now til test on Wednesday. Pt will take metoprolol 2 hr prior to scan unless BP checked at home is <262 systolic. Pt verbalized understanding to also hold antidiabetes medications 48 hrs post CTA.  I made an appt in infusion clinic for IV hydration per [facility] radiology protocol for his GFR < 45. (result = 44). Pt verbalized understanding. Clarise Cruz

## 2019-12-26 ENCOUNTER — Other Ambulatory Visit: Payer: Self-pay

## 2019-12-26 ENCOUNTER — Ambulatory Visit (HOSPITAL_COMMUNITY)
Admission: RE | Admit: 2019-12-26 | Discharge: 2019-12-26 | Disposition: A | Discharge status: Home / Self Care | Payer: Medicare HMO | Source: Ambulatory Visit | Attending: Cardiology | Admitting: Cardiology

## 2019-12-26 DIAGNOSIS — I700 Atherosclerosis of aorta: Secondary | ICD-10-CM | POA: Diagnosis not present

## 2019-12-26 DIAGNOSIS — I2584 Coronary atherosclerosis due to calcified coronary lesion: Secondary | ICD-10-CM | POA: Diagnosis present

## 2019-12-26 DIAGNOSIS — I2510 Atherosclerotic heart disease of native coronary artery without angina pectoris: Secondary | ICD-10-CM | POA: Insufficient documentation

## 2019-12-26 DIAGNOSIS — E119 Type 2 diabetes mellitus without complications: Secondary | ICD-10-CM

## 2019-12-26 LAB — BASIC METABOLIC PANEL
Anion gap: 9 (ref 5–15)
BUN: 15 mg/dL (ref 8–23)
CO2: 26 mmol/L (ref 22–32)
Calcium: 8.9 mg/dL (ref 8.9–10.3)
Chloride: 105 mmol/L (ref 98–111)
Creatinine, Ser: 1.15 mg/dL (ref 0.61–1.24)
GFR, Estimated: >60 mL/min (ref >60)
Glucose, Bld: 125 mg/dL — ABNORMAL HIGH (ref 70–99)
Potassium: 3.6 mmol/L (ref 3.5–5.1)
Sodium: 140 mmol/L (ref 135–145)

## 2019-12-26 MED ORDER — SODIUM CHLORIDE 0.9 % WEIGHT BASED INFUSION: 1.0000 mL/kg/h | INTRAVENOUS | Status: DC

## 2019-12-26 MED ORDER — NITROGLYCERIN 0.4 MG SL SUBL
SUBLINGUAL_TABLET | SUBLINGUAL | Status: AC
  Administered 2019-12-26: 0.8 mg via SUBLINGUAL
  Filled 2019-12-26: qty 2

## 2019-12-26 MED ORDER — NITROGLYCERIN 0.4 MG SL SUBL: 0.8000 mg | SUBLINGUAL_TABLET | Freq: Once | SUBLINGUAL | Status: AC

## 2019-12-26 MED ORDER — IOHEXOL 350 MG/ML SOLN
80.0000 mL | Freq: Once | INTRAVENOUS | Status: AC | PRN
  Administered 2019-12-26: 80 mL via INTRAVENOUS

## 2019-12-26 MED ORDER — SODIUM CHLORIDE 0.9 % WEIGHT BASED INFUSION
3.0000 mL/kg/h | INTRAVENOUS | Status: AC
  Administered 2019-12-26: 3 mL/kg/h via INTRAVENOUS

## 2019-12-26 NOTE — Progress Notes (Signed)
Notified Whitney in Radiology that 1 hour IVF will be complete by 1415.

## 2019-12-27 ENCOUNTER — Telehealth: Payer: Self-pay

## 2019-12-27 DIAGNOSIS — I2510 Atherosclerotic heart disease of native coronary artery without angina pectoris: Secondary | ICD-10-CM | POA: Diagnosis not present

## 2019-12-27 NOTE — Telephone Encounter (Signed)
Spoke with patient regarding results and recommendation.  Patient verbalizes understanding and is agreeable to plan of care. Advised patient to call back with any issues or concerns.  

## 2019-12-27 NOTE — Telephone Encounter (Signed)
-----   Message from Berniece Salines, DO sent at 12/27/2019  4:18 PM EDT ----- Lab work normal blood glucose slightly elevated

## 2019-12-28 ENCOUNTER — Ambulatory Visit: Payer: Medicare HMO | Admitting: Cardiology

## 2019-12-28 ENCOUNTER — Other Ambulatory Visit: Payer: Self-pay

## 2019-12-28 ENCOUNTER — Encounter: Payer: Self-pay | Admitting: Cardiology

## 2019-12-28 ENCOUNTER — Telehealth: Payer: Self-pay | Admitting: Cardiology

## 2019-12-28 VITALS — BP 120/70 | HR 75 | Ht 70.0 in | Wt 206.0 lb

## 2019-12-28 DIAGNOSIS — R931 Abnormal findings on diagnostic imaging of heart and coronary circulation: Secondary | ICD-10-CM | POA: Diagnosis not present

## 2019-12-28 DIAGNOSIS — E119 Type 2 diabetes mellitus without complications: Secondary | ICD-10-CM

## 2019-12-28 DIAGNOSIS — I10 Essential (primary) hypertension: Secondary | ICD-10-CM

## 2019-12-28 DIAGNOSIS — I2510 Atherosclerotic heart disease of native coronary artery without angina pectoris: Secondary | ICD-10-CM

## 2019-12-28 DIAGNOSIS — E782 Mixed hyperlipidemia: Secondary | ICD-10-CM

## 2019-12-28 NOTE — Progress Notes (Signed)
Cardiology Office Note:    Date:  12/28/2019   ID:  Ronald Cruz, DOB Nov 09, 1950, MRN [id number]  PCP:  Ronald Limbo, MD  Cardiologist:  Ronald Salines, DO  Electrophysiologist:  None   Referring MD: Ronald Limbo, MD     History of Present Illness:    Ronald Cruz is a 69 y.o. male with a hx of hypertension, hyperlipidemia, diabetes and subtle memory loss and subtle memory loss comes today for follow-up visit.  Last saw the patient on December 03, 2019 at that time we discussed his results from his coronary calcium scoring.  During his last encounter we discussed planning for coronary CTA.  As well as he did have some bilateral leg edema I placed the patient on Lasix 20 mg Tuesday Thursday and Saturdays.  I switch his statin to high intensity Crestor.  He did get the cardiac calcium scoring which showed significant adding some score of 1944.  Due to significant calcium burden we pursued a cardiac CTA to be able to assess the patient's anatomy.  He did get his cardiac CT he is here today to discuss these results.  He denies any chest pain.  No shortness of breath. Fatigue when he gets significant exertion.  Past Medical History:  Diagnosis Date  . Acute pain of left shoulder 03/30/2017  . Allergic rhinitis 08/19/2012  . ASCVD (arteriosclerotic cardiovascular disease) 10/24/2019  . Cervical spondylosis 07/20/2010  . Chronic midline low back pain without sciatica 08/28/2019   Last Assessment & Plan:  Formatting of this note might be different from the original. This pain is chronic, but he does not take medication, nor have any radicular symptoms.  Because he can have multiple diagnostic imaging studies performed on the same day for a single co-pay, he desires MRI of the lumbar spine to be scheduled along with previously planned MRI of the brain.  MRI and plain films o  . Contact dermatitis and eczema due to plant 06/28/2012   Formatting of this note might be different from the original.  Overview:  IMPRESSION: Topical itch creams advised also.  Steroid will affect blood sugar of next few days. Formatting of this note might be different from the original. IMPRESSION: Topical itch creams advised also.  Steroid will affect blood sugar of next few days. Formatting of this note might be different from the original. Overview:  . Depressive disorder 08/19/2012  . 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 Overview:  IMPRESSION: Pt's Levitra has not worked. We will change to Viagra. Welbutrin usually does not affect erection. It could be more a result of his DM Formatting of this note might be different from the original. IMPRESSION: Pt's Levitra has not worked. We will change to Viagra. Welbutrin usually does not affect erection. It coul  . Essential hypertension 08/19/2012  . Herpes zoster 05/02/2013   Formatting of this note might be different from the original. Overview:  STORY: rash since sunday, post 72 hours window for antiviral therapy. `E1o3L`pain described is consistant with shingles, the rash is not vesicular, but has been scratching areas.`E1o3L`IMPRESSION: pain bearable. will watch and start neurontin if needed Formatting of this note might be different from the original. STORY: rash   . Hyperlipidemia 10/24/2019  . Lump in neck 08/28/2019   Formatting of this note might be different from the original. RIGHT, just posterior to the angle of the mandible.  Nontender.  Soft and mobile.  Last  Assessment & Plan:  Formatting of this note might be different from the original. Right, just posterior to the angle of the mandible.  Nontender.  Soft and mobile.  This is not concerning for neoplasm.  . Memory changes 08/21/2019  . Mixed hyperlipidemia 05/02/2013   Formatting of this note might be different from the original. Overview:  IMPRESSION: The goal is to have your total cholesterol < 200, the HDL (good cholesterol) >40, and  the LDL (bad cholesterol) <100.`E1o3L`It is recomended that you follow a good low fat diet and exercise for 30 minutes 3-4 times a week. Formatting of this note might be different from the original. IMPRESSION: The goal is to hav  . Obesity 05/02/2013  . Obesity (BMI 30-39.9) 10/24/2019  . Onychomycosis 12/31/2013  . 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 Pain of left hand 03/30/2017  . Pain of right shoulder joint on movement 03/30/2017  . 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 Screening for colon cancer 10/17/2015  . Screening for eye condition 05/02/2013  . Tendinitis of left shoulder 09/25/2014  . Tinea corporis 11/04/2016  . Trigger finger 11/23/2017  . Type 2 diabetes mellitus without complication, without long-term current use of insulin ([place]) 10/24/2019  . Type II diabetes mellitus ([place]) 05/02/2013   Formatting of this note might be different from the original. Overview:  IMPRESSION: The goal is for the Hgb A1C to be less than 7.0.`E1o3L`It is recommended that all diatetics are educated on and follow a healthy diabetic diet, exercise for 30 minutes 3-4 times per week (walking, biking, swimming, or machine), monitor blood glucose readings and bring that record with you to be reviewed at your ne  . Vitamin D deficiency 08/18/2011   Formatting of this note might be different from the original. Overview:  IMPRESSION: patient taking Vitamin D 2000IU per day. His levels are normal. Continue same dose Formatting of this note might be different from the original. IMPRESSION: patient taking Vitamin D 2000IU per day. His levels are normal. Continue same dose Formatting of this note might be different from the original. Overview:  Ov    Past Surgical History:  Procedure Laterality Date  . HAND SURGERY Right    Thumb  . HERNIA REPAIR      Current Medications: Current Meds  Medication Sig  . amLODipine (NORVASC) 5 MG tablet Take 1 tablet (5 mg total) by mouth daily.  . Ascorbic Acid  (VITAMIN C) 1000 MG tablet Take 1,000 mg by mouth daily.  Marland Kitchen aspirin EC 81 MG tablet Take 81 mg by mouth daily. Swallow whole.  . benazepril (LOTENSIN) 20 MG tablet Take 20 mg by mouth daily.  . clotrimazole-betamethasone (LOTRISONE) cream Apply 1 application topically 2 (two) times daily.  . cyanocobalamin 1000 MCG tablet Take 1 tablet by mouth in the morning and at bedtime.  . Empagliflozin-linaGLIPtin (GLYXAMBI) 25-5 MG TABS Take 1 tablet by mouth daily.  Marland Kitchen glipiZIDE (GLUCOTROL XL) 10 MG 24 hr tablet Take 10 mg by mouth in the morning and at bedtime.  . hydrochlorothiazide (HYDRODIURIL) 25 MG tablet Take 25 mg by mouth daily.  . metFORMIN (GLUCOPHAGE) 1000 MG tablet Take 1,000 mg by mouth 2 (two) times daily with a meal.  . rosuvastatin (CRESTOR) 20 MG tablet Take 1 tablet (20 mg total) by mouth daily.  . tadalafil (CIALIS) 5 MG tablet Take 5 mg by mouth daily as needed for erectile dysfunction.  . traZODone (DESYREL) 50 MG tablet Take 50 mg by mouth  at bedtime.  . Vitamin D, Ergocalciferol, 50 MCG (2000 UT) CAPS Take by mouth daily.  . vitamin E 45 MG (100 UNITS) capsule Take 1 capsule by mouth daily.  . WHEAT GERM OIL PO Take 1 tablet by mouth in the morning and at bedtime.     Allergies:   Patient has no known allergies.   Social History   Socioeconomic History  . Marital status: Married    Spouse name: Not on file  . Number of children: Not on file  . Years of education: Not on file  . Highest education level: Not on file  Occupational History  . Not on file  Tobacco Use  . Smoking status: Never Smoker  . Smokeless tobacco: Never Used  Substance and Sexual Activity  . Alcohol use: Yes    Comment: beer once or twice a month  . Drug use: Never  . Sexual activity: Not on file  Other Topics Concern  . Not on file  Social History Narrative  . Not on file   Social Determinants of Health   Financial Resource Strain:   . Difficulty of Paying Living Expenses: Not on file    Food Insecurity:   . Worried About Charity fundraiser in the Last Year: Not on file  . Ran Out of Food in the Last Year: Not on file  Transportation Needs:   . Lack of Transportation (Medical): Not on file  . Lack of Transportation (Non-Medical): Not on file  Physical Activity:   . Days of Exercise per Week: Not on file  . Minutes of Exercise per Session: Not on file  Stress:   . Feeling of Stress : Not on file  Social Connections:   . Frequency of Communication with Friends and Family: Not on file  . Frequency of Social Gatherings with Friends and Family: Not on file  . Attends Religious Services: Not on file  . Active Member of Clubs or Organizations: Not on file  . Attends Archivist Meetings: Not on file  . Marital Status: Not on file     Family History: The patient's family history includes Cancer in his paternal grandfather; Esophageal cancer in his mother; Stroke in his father.  ROS:   Review of Systems  Constitution: Negative for decreased appetite, fever and weight gain.  HENT: Negative for congestion, ear discharge, hoarse voice and sore throat.   Eyes: Negative for discharge, redness, vision loss in right eye and visual halos.  Cardiovascular: Negative for chest pain, dyspnea on exertion, leg swelling, orthopnea and palpitations.  Respiratory: Negative for cough, hemoptysis, shortness of breath and snoring.   Endocrine: Negative for heat intolerance and polyphagia.  Hematologic/Lymphatic: Negative for bleeding problem. Does not bruise/bleed easily.  Skin: Negative for flushing, nail changes, rash and suspicious lesions.  Musculoskeletal: Negative for arthritis, joint pain, muscle cramps, myalgias, neck pain and stiffness.  Gastrointestinal: Negative for abdominal pain, bowel incontinence, diarrhea and excessive appetite.  Genitourinary: Negative for decreased libido, genital sores and incomplete emptying.  Neurological: Negative for brief paralysis, focal  weakness, headaches and loss of balance.  Psychiatric/Behavioral: Negative for altered mental status, depression and suicidal ideas.  Allergic/Immunologic: Negative for HIV exposure and persistent infections.    EKGs/Labs/Other Studies Reviewed:    The following studies were reviewed today:   EKG:  The ekg ordered today demonstrates sinus rhythm, heart rate 66 bpm.  FFRCT 1. Left Main: 0.98  2. LAD: Proximal 0.90, Mid 0.70, Distal 0.71  3. LCX:  Proximal 0.92, Mid 0.90, Distal 0.75  4. RCA: Proximal 0.98, Mid 0.76, Distal not analyzed  IMPRESSION: Based on FFRct data above, there is flow limiting lesion in the mid to distal LAD. Recommend cardiac catheterization.  Note: These examples are not recommendations of [employer] and only provided as examples of what other customers are doing.   Electronically Signed   By: Ronald Salines DO  Cardiac CT 12/26/2019 Aorta: Normal size.  No calcifications.  No dissection.  Aortic Valve:  Trileaflet.  No calcifications.  Coronary Arteries: Normal coronary origin.  left dominance.  RCA is a small non-dominant artery. There is a mild (24-49%) calcified plaque in the proximal RCA. The mid portion with moderate (50-69%) calcified plaque. The small distal portion tapers of with no plaques.  Left main is a large artery that gives rise to LAD and LCX arteries. There is a moderate (50-69%) calcified plaque in the distal left main.  LAD is a large vessel that has no plaque. There is a proximal mild (25-49%) calcified plaque.  LCX is a large dominant artery that gives rise to one large OM1 branch, the PDA and PLVB. There is a long (18.3 mm) moderate (50-69%) calcified plaque in the proximal LCX. The mid portion of the LCX with tubular, segmental mild (25-49%) plaques. The LPDA with mild (24-49%) calcified plaque.  Other findings:  Normal pulmonary vein drainage into the left atrium.  Normal left atrial appendage  without a thrombus.  Normal size of the pulmonary artery.  IMPRESSION: 1. Coronary calcium score of 1944. This was 68 percentile for age and sex matched control.  2. Normal coronary origin with left dominance.  3. Moderate coronary artery disease. CADRADS 3. This study will be send for FFRct. Recommend aggressive medical therapy with antianginals  Recent Labs: 12/20/2019: Magnesium 1.7 12/26/2019: BUN 15; Creatinine, Ser 1.15; Potassium 3.6; Sodium 140  Recent Lipid Panel No results found for: CHOL, TRIG, HDL, CHOLHDL, VLDL, LDLCALC, LDLDIRECT  Physical Exam:    VS:  BP 120/70   Pulse 75   Ht 5\' 10"  (1.778 m)   Wt 206 lb (93.4 kg)   SpO2 97%   BMI 29.56 kg/m     Wt Readings from Last 3 Encounters:  12/28/19 206 lb (93.4 kg)  12/20/19 204 lb 9.6 oz (92.8 kg)  12/03/19 201 lb (91.2 kg)     GEN: Well nourished, well developed in no acute distress HEENT: Normal NECK: No JVD; No carotid bruits LYMPHATICS: No lymphadenopathy CARDIAC: S1S2 noted,RRR, no murmurs, rubs, gallops RESPIRATORY:  Clear to auscultation without rales, wheezing or rhonchi  ABDOMEN: Soft, non-tender, non-distended, +bowel sounds, no guarding. EXTREMITIES: No edema, No cyanosis, no clubbing MUSCULOSKELETAL:  No deformity  SKIN: Warm and dry NEUROLOGIC:  Alert and oriented x 3, non-focal PSYCHIATRIC:  Normal affect, good insight  ASSESSMENT:    1. Abnormal FFRct   2. CAD in native artery   3. Essential hypertension   4. Type 2 diabetes mellitus without complication, without long-term current use of insulin ([place])   5. Mixed hyperlipidemia    PLAN:     During his visit today we discussed extensively his CTA as well as the FFR CT results.  I explained to the patient the difference of both test.  He has specific questions which we were able to answer.  Ideally like he has his risk factors which include diabetes we talked about the possible options which include moving forward with a left heart  catheterization and the option  of medical management.  At this time shared decision that we we will pursue a left heart catheterization.  The patient understands that risks include but are not limited to stroke (1 in 1000), death (1 in 31), kidney failure [usually temporary] (1 in 500), bleeding (1 in 200), allergic reaction [possibly serious] (1 in 200), and agrees to proceed.  He will be calling the office for a time to schedule as he wants to do some investigation on the interventionalists.  In the meantime I will continue the patient on aspirin 81 mg daily, Crestor 20 mg daily.  Diabetes mellitus per his PCP. Hyperlipidemia continue Crestor.  The patient is in agreement with the above plan. The patient left the office in stable condition.  The patient will follow up in 3 months or sooner if needed.   Medication Adjustments/Labs and Tests Ordered: Current medicines are reviewed at length with the patient today.  Concerns regarding medicines are outlined above.  Orders Placed This Encounter  Procedures  . CBC  . EKG 12-Lead   No orders of the defined types were placed in this encounter.   Patient Instructions  Medication Instructions:  Your physician recommends that you continue on your current medications as directed. Please refer to the Current Medication list given to you today.  *If you need a refill on your cardiac medications before your next appointment, please call your pharmacy*   Lab Work: Your physician recommends that you return for lab work today: cbc  If you have labs (blood work) drawn today and your tests are completely normal, you will receive your results only by: Marland Kitchen MyChart Message (if you have MyChart) OR . A paper copy in the mail If you have any lab test that is abnormal or we need to change your treatment, we will call you to review the results.   Testing/Procedures: None   Follow-Up: At [facility], you and your health needs are our priority.   As part of our continuing mission to provide you with exceptional heart care, we have created designated Provider Care Teams.  These Care Teams include your primary Cardiologist (physician) and Advanced Practice Providers (APPs -  Physician Assistants and Nurse Practitioners) who all work together to provide you with the care you need, when you need it.  We recommend signing up for the patient portal called "MyChart".  Sign up information is provided on this After Visit Summary.  MyChart is used to connect with patients for Virtual Visits (Telemedicine).  Patients are able to view lab/test results, encounter notes, upcoming appointments, etc.  Non-urgent messages can be sent to your provider as well.   To learn more about what you can do with MyChart, go to [email or web address].    Your next appointment:   3 month(s)  The format for your next appointment:   In Person  Provider:   Berniece Salines, DO   Other Instructions  Please call us back to schedule cath.      Adopting a Healthy Lifestyle.  Know what a healthy weight is for you (roughly BMI <25) and aim to maintain this   Aim for 7+ servings of fruits and vegetables daily   65-80+ fluid ounces of water or unsweet tea for healthy kidneys   Limit to max 1 drink of alcohol per day; avoid smoking/tobacco   Limit animal fats in diet for cholesterol and heart health - choose grass fed whenever available   Avoid highly processed foods, and foods high in saturated/trans fats  Aim for low stress - take time to unwind and care for your mental health   Aim for 150 min of moderate intensity exercise weekly for heart health, and weights twice weekly for bone health   Aim for 7-9 hours of sleep daily   When it comes to diets, agreement about the perfect plan isnt easy to find, even among the experts. Experts at the [employer] developed an idea known as the Healthy Eating Plate. Just imagine a plate divided into  logical, healthy portions.   The emphasis is on diet quality:   Load up on vegetables and fruits - one-half of your plate: Aim for color and variety, and remember that potatoes dont count.   Go for whole grains - one-quarter of your plate: Whole wheat, barley, wheat berries, quinoa, oats, brown rice, and foods made with them. If you want pasta, go with whole wheat pasta.   Protein power - one-quarter of your plate: Fish, chicken, beans, and nuts are all healthy, versatile protein sources. Limit red meat.   The diet, however, does go beyond the plate, offering a few other suggestions.   Use healthy plant oils, such as olive, canola, soy, corn, sunflower and peanut. Check the labels, and avoid partially hydrogenated oil, which have unhealthy trans fats.   If youre thirsty, drink water. Coffee and tea are good in moderation, but skip sugary drinks and limit milk and dairy products to one or two daily servings.   The type of carbohydrate in the diet is more important than the amount. Some sources of carbohydrates, such as vegetables, fruits, whole grains, and beans-are healthier than others.   Finally, stay active  Signed, Ronald Salines, DO  12/28/2019 10:59 PM     [facility]

## 2019-12-28 NOTE — Patient Instructions (Signed)
Medication Instructions:  Your physician recommends that you continue on your current medications as directed. Please refer to the Current Medication list given to you today.  *If you need a refill on your cardiac medications before your next appointment, please call your pharmacy*   Lab Work: Your physician recommends that you return for lab work today: cbc  If you have labs (blood work) drawn today and your tests are completely normal, you will receive your results only by: Marland Kitchen MyChart Message (if you have MyChart) OR . A paper copy in the mail If you have any lab test that is abnormal or we need to change your treatment, we will call you to review the results.   Testing/Procedures: None   Follow-Up: At [facility], you and your health needs are our priority.  As part of our continuing mission to provide you with exceptional heart care, we have created designated Provider Care Teams.  These Care Teams include your primary Cardiologist (physician) and Advanced Practice Providers (APPs -  Physician Assistants and Nurse Practitioners) who all work together to provide you with the care you need, when you need it.  We recommend signing up for the patient portal called "MyChart".  Sign up information is provided on this After Visit Summary.  MyChart is used to connect with patients for Virtual Visits (Telemedicine).  Patients are able to view lab/test results, encounter notes, upcoming appointments, etc.  Non-urgent messages can be sent to your provider as well.   To learn more about what you can do with MyChart, go to [email or web address].    Your next appointment:   3 month(s)  The format for your next appointment:   In Person  Provider:   Berniece Salines, DO   Other Instructions  Please call us back to schedule cath.

## 2019-12-28 NOTE — Telephone Encounter (Signed)
Message sent on mychart 

## 2019-12-28 NOTE — H&P (View-Only) (Signed)
Cardiology Office Note:    Date:  12/28/2019   ID:  Ronald Cruz, DOB March 18, 1950, MRN [id number]  PCP:  Bernerd Limbo, MD  Cardiologist:  Berniece Salines, DO  Electrophysiologist:  None   Referring MD: Bernerd Limbo, MD     History of Present Illness:    Ronald Cruz is a 69 y.o. male with a hx of hypertension, hyperlipidemia, diabetes and subtle memory loss and subtle memory loss comes today for follow-up visit.  Last saw the patient on December 03, 2019 at that time we discussed his results from his coronary calcium scoring.  During his last encounter we discussed planning for coronary CTA.  As well as he did have some bilateral leg edema I placed the patient on Lasix 20 mg Tuesday Thursday and Saturdays.  I switch his statin to high intensity Crestor.  He did get the cardiac calcium scoring which showed significant adding some score of 1944.  Due to significant calcium burden we pursued a cardiac CTA to be able to assess the patient's anatomy.  He did get his cardiac CT he is here today to discuss these results.  He denies any chest pain.  No shortness of breath. Fatigue when he gets significant exertion.  Past Medical History:  Diagnosis Date  . Acute pain of left shoulder 03/30/2017  . Allergic rhinitis 08/19/2012  . ASCVD (arteriosclerotic cardiovascular disease) 10/24/2019  . Cervical spondylosis 07/20/2010  . Chronic midline low back pain without sciatica 08/28/2019   Last Assessment & Plan:  Formatting of this note might be different from the original. This pain is chronic, but he does not take medication, nor have any radicular symptoms.  Because he can have multiple diagnostic imaging studies performed on the same day for a single co-pay, he desires MRI of the lumbar spine to be scheduled along with previously planned MRI of the brain.  MRI and plain films o  . Contact dermatitis and eczema due to plant 06/28/2012   Formatting of this note might be different from the original.  Overview:  IMPRESSION: Topical itch creams advised also.  Steroid will affect blood sugar of next few days. Formatting of this note might be different from the original. IMPRESSION: Topical itch creams advised also.  Steroid will affect blood sugar of next few days. Formatting of this note might be different from the original. Overview:  . Depressive disorder 08/19/2012  . 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 Overview:  IMPRESSION: Pt's Levitra has not worked. We will change to Viagra. Welbutrin usually does not affect erection. It could be more a result of his DM Formatting of this note might be different from the original. IMPRESSION: Pt's Levitra has not worked. We will change to Viagra. Welbutrin usually does not affect erection. It coul  . Essential hypertension 08/19/2012  . Herpes zoster 05/02/2013   Formatting of this note might be different from the original. Overview:  STORY: rash since sunday, post 72 hours window for antiviral therapy. `E1o3L`pain described is consistant with shingles, the rash is not vesicular, but has been scratching areas.`E1o3L`IMPRESSION: pain bearable. will watch and start neurontin if needed Formatting of this note might be different from the original. STORY: rash   . Hyperlipidemia 10/24/2019  . Lump in neck 08/28/2019   Formatting of this note might be different from the original. RIGHT, just posterior to the angle of the mandible.  Nontender.  Soft and mobile.  Last  Assessment & Plan:  Formatting of this note might be different from the original. Right, just posterior to the angle of the mandible.  Nontender.  Soft and mobile.  This is not concerning for neoplasm.  . Memory changes 08/21/2019  . Mixed hyperlipidemia 05/02/2013   Formatting of this note might be different from the original. Overview:  IMPRESSION: The goal is to have your total cholesterol < 200, the HDL (good cholesterol) >40, and  the LDL (bad cholesterol) <100.`E1o3L`It is recomended that you follow a good low fat diet and exercise for 30 minutes 3-4 times a week. Formatting of this note might be different from the original. IMPRESSION: The goal is to hav  . Obesity 05/02/2013  . Obesity (BMI 30-39.9) 10/24/2019  . Onychomycosis 12/31/2013  . 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 Pain of left hand 03/30/2017  . Pain of right shoulder joint on movement 03/30/2017  . 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 Screening for colon cancer 10/17/2015  . Screening for eye condition 05/02/2013  . Tendinitis of left shoulder 09/25/2014  . Tinea corporis 11/04/2016  . Trigger finger 11/23/2017  . Type 2 diabetes mellitus without complication, without long-term current use of insulin ([place]) 10/24/2019  . Type II diabetes mellitus ([place]) 05/02/2013   Formatting of this note might be different from the original. Overview:  IMPRESSION: The goal is for the Hgb A1C to be less than 7.0.`E1o3L`It is recommended that all diatetics are educated on and follow a healthy diabetic diet, exercise for 30 minutes 3-4 times per week (walking, biking, swimming, or machine), monitor blood glucose readings and bring that record with you to be reviewed at your ne  . Vitamin D deficiency 08/18/2011   Formatting of this note might be different from the original. Overview:  IMPRESSION: patient taking Vitamin D 2000IU per day. His levels are normal. Continue same dose Formatting of this note might be different from the original. IMPRESSION: patient taking Vitamin D 2000IU per day. His levels are normal. Continue same dose Formatting of this note might be different from the original. Overview:  Ov    Past Surgical History:  Procedure Laterality Date  . HAND SURGERY Right    Thumb  . HERNIA REPAIR      Current Medications: Current Meds  Medication Sig  . amLODipine (NORVASC) 5 MG tablet Take 1 tablet (5 mg total) by mouth daily.  . Ascorbic Acid  (VITAMIN C) 1000 MG tablet Take 1,000 mg by mouth daily.  Marland Kitchen aspirin EC 81 MG tablet Take 81 mg by mouth daily. Swallow whole.  . benazepril (LOTENSIN) 20 MG tablet Take 20 mg by mouth daily.  . clotrimazole-betamethasone (LOTRISONE) cream Apply 1 application topically 2 (two) times daily.  . cyanocobalamin 1000 MCG tablet Take 1 tablet by mouth in the morning and at bedtime.  . Empagliflozin-linaGLIPtin (GLYXAMBI) 25-5 MG TABS Take 1 tablet by mouth daily.  Marland Kitchen glipiZIDE (GLUCOTROL XL) 10 MG 24 hr tablet Take 10 mg by mouth in the morning and at bedtime.  . hydrochlorothiazide (HYDRODIURIL) 25 MG tablet Take 25 mg by mouth daily.  . metFORMIN (GLUCOPHAGE) 1000 MG tablet Take 1,000 mg by mouth 2 (two) times daily with a meal.  . rosuvastatin (CRESTOR) 20 MG tablet Take 1 tablet (20 mg total) by mouth daily.  . tadalafil (CIALIS) 5 MG tablet Take 5 mg by mouth daily as needed for erectile dysfunction.  . traZODone (DESYREL) 50 MG tablet Take 50 mg by mouth  at bedtime.  . Vitamin D, Ergocalciferol, 50 MCG (2000 UT) CAPS Take by mouth daily.  . vitamin E 45 MG (100 UNITS) capsule Take 1 capsule by mouth daily.  . WHEAT GERM OIL PO Take 1 tablet by mouth in the morning and at bedtime.     Allergies:   Patient has no known allergies.   Social History   Socioeconomic History  . Marital status: Married    Spouse name: Not on file  . Number of children: Not on file  . Years of education: Not on file  . Highest education level: Not on file  Occupational History  . Not on file  Tobacco Use  . Smoking status: Never Smoker  . Smokeless tobacco: Never Used  Substance and Sexual Activity  . Alcohol use: Yes    Comment: beer once or twice a month  . Drug use: Never  . Sexual activity: Not on file  Other Topics Concern  . Not on file  Social History Narrative  . Not on file   Social Determinants of Health   Financial Resource Strain:   . Difficulty of Paying Living Expenses: Not on file    Food Insecurity:   . Worried About Charity fundraiser in the Last Year: Not on file  . Ran Out of Food in the Last Year: Not on file  Transportation Needs:   . Lack of Transportation (Medical): Not on file  . Lack of Transportation (Non-Medical): Not on file  Physical Activity:   . Days of Exercise per Week: Not on file  . Minutes of Exercise per Session: Not on file  Stress:   . Feeling of Stress : Not on file  Social Connections:   . Frequency of Communication with Friends and Family: Not on file  . Frequency of Social Gatherings with Friends and Family: Not on file  . Attends Religious Services: Not on file  . Active Member of Clubs or Organizations: Not on file  . Attends Archivist Meetings: Not on file  . Marital Status: Not on file     Family History: The patient's family history includes Cancer in his paternal grandfather; Esophageal cancer in his mother; Stroke in his father.  ROS:   Review of Systems  Constitution: Negative for decreased appetite, fever and weight gain.  HENT: Negative for congestion, ear discharge, hoarse voice and sore throat.   Eyes: Negative for discharge, redness, vision loss in right eye and visual halos.  Cardiovascular: Negative for chest pain, dyspnea on exertion, leg swelling, orthopnea and palpitations.  Respiratory: Negative for cough, hemoptysis, shortness of breath and snoring.   Endocrine: Negative for heat intolerance and polyphagia.  Hematologic/Lymphatic: Negative for bleeding problem. Does not bruise/bleed easily.  Skin: Negative for flushing, nail changes, rash and suspicious lesions.  Musculoskeletal: Negative for arthritis, joint pain, muscle cramps, myalgias, neck pain and stiffness.  Gastrointestinal: Negative for abdominal pain, bowel incontinence, diarrhea and excessive appetite.  Genitourinary: Negative for decreased libido, genital sores and incomplete emptying.  Neurological: Negative for brief paralysis, focal  weakness, headaches and loss of balance.  Psychiatric/Behavioral: Negative for altered mental status, depression and suicidal ideas.  Allergic/Immunologic: Negative for HIV exposure and persistent infections.    EKGs/Labs/Other Studies Reviewed:    The following studies were reviewed today:   EKG:  The ekg ordered today demonstrates sinus rhythm, heart rate 66 bpm.  FFRCT 1. Left Main: 0.98  2. LAD: Proximal 0.90, Mid 0.70, Distal 0.71  3. LCX:  Proximal 0.92, Mid 0.90, Distal 0.75  4. RCA: Proximal 0.98, Mid 0.76, Distal not analyzed  IMPRESSION: Based on FFRct data above, there is flow limiting lesion in the mid to distal LAD. Recommend cardiac catheterization.  Note: These examples are not recommendations of [employer] and only provided as examples of what other customers are doing.   Electronically Signed   By: Berniece Salines DO  Cardiac CT 12/26/2019 Aorta: Normal size.  No calcifications.  No dissection.  Aortic Valve:  Trileaflet.  No calcifications.  Coronary Arteries: Normal coronary origin.  left dominance.  RCA is a small non-dominant artery. There is a mild (24-49%) calcified plaque in the proximal RCA. The mid portion with moderate (50-69%) calcified plaque. The small distal portion tapers of with no plaques.  Left main is a large artery that gives rise to LAD and LCX arteries. There is a moderate (50-69%) calcified plaque in the distal left main.  LAD is a large vessel that has no plaque. There is a proximal mild (25-49%) calcified plaque.  LCX is a large dominant artery that gives rise to one large OM1 branch, the PDA and PLVB. There is a long (18.3 mm) moderate (50-69%) calcified plaque in the proximal LCX. The mid portion of the LCX with tubular, segmental mild (25-49%) plaques. The LPDA with mild (24-49%) calcified plaque.  Other findings:  Normal pulmonary vein drainage into the left atrium.  Normal left atrial appendage  without a thrombus.  Normal size of the pulmonary artery.  IMPRESSION: 1. Coronary calcium score of 1944. This was 64 percentile for age and sex matched control.  2. Normal coronary origin with left dominance.  3. Moderate coronary artery disease. CADRADS 3. This study will be send for FFRct. Recommend aggressive medical therapy with antianginals  Recent Labs: 12/20/2019: Magnesium 1.7 12/26/2019: BUN 15; Creatinine, Ser 1.15; Potassium 3.6; Sodium 140  Recent Lipid Panel No results found for: CHOL, TRIG, HDL, CHOLHDL, VLDL, LDLCALC, LDLDIRECT  Physical Exam:    VS:  BP 120/70   Pulse 75   Ht 5\' 10"  (1.778 m)   Wt 206 lb (93.4 kg)   SpO2 97%   BMI 29.56 kg/m     Wt Readings from Last 3 Encounters:  12/28/19 206 lb (93.4 kg)  12/20/19 204 lb 9.6 oz (92.8 kg)  12/03/19 201 lb (91.2 kg)     GEN: Well nourished, well developed in no acute distress HEENT: Normal NECK: No JVD; No carotid bruits LYMPHATICS: No lymphadenopathy CARDIAC: S1S2 noted,RRR, no murmurs, rubs, gallops RESPIRATORY:  Clear to auscultation without rales, wheezing or rhonchi  ABDOMEN: Soft, non-tender, non-distended, +bowel sounds, no guarding. EXTREMITIES: No edema, No cyanosis, no clubbing MUSCULOSKELETAL:  No deformity  SKIN: Warm and dry NEUROLOGIC:  Alert and oriented x 3, non-focal PSYCHIATRIC:  Normal affect, good insight  ASSESSMENT:    1. Abnormal FFRct   2. CAD in native artery   3. Essential hypertension   4. Type 2 diabetes mellitus without complication, without long-term current use of insulin ([place])   5. Mixed hyperlipidemia    PLAN:     During his visit today we discussed extensively his CTA as well as the FFR CT results.  I explained to the patient the difference of both test.  He has specific questions which we were able to answer.  Ideally like he has his risk factors which include diabetes we talked about the possible options which include moving forward with a left heart  catheterization and the option  of medical management.  At this time shared decision that we we will pursue a left heart catheterization.  The patient understands that risks include but are not limited to stroke (1 in 1000), death (1 in 93), kidney failure [usually temporary] (1 in 500), bleeding (1 in 200), allergic reaction [possibly serious] (1 in 200), and agrees to proceed.  He will be calling the office for a time to schedule as he wants to do some investigation on the interventionalists.  In the meantime I will continue the patient on aspirin 81 mg daily, Crestor 20 mg daily.  Diabetes mellitus per his PCP. Hyperlipidemia continue Crestor.  The patient is in agreement with the above plan. The patient left the office in stable condition.  The patient will follow up in 3 months or sooner if needed.   Medication Adjustments/Labs and Tests Ordered: Current medicines are reviewed at length with the patient today.  Concerns regarding medicines are outlined above.  Orders Placed This Encounter  Procedures  . CBC  . EKG 12-Lead   No orders of the defined types were placed in this encounter.   Patient Instructions  Medication Instructions:  Your physician recommends that you continue on your current medications as directed. Please refer to the Current Medication list given to you today.  *If you need a refill on your cardiac medications before your next appointment, please call your pharmacy*   Lab Work: Your physician recommends that you return for lab work today: cbc  If you have labs (blood work) drawn today and your tests are completely normal, you will receive your results only by: Marland Kitchen MyChart Message (if you have MyChart) OR . A paper copy in the mail If you have any lab test that is abnormal or we need to change your treatment, we will call you to review the results.   Testing/Procedures: None   Follow-Up: At [facility], you and your health needs are our priority.   As part of our continuing mission to provide you with exceptional heart care, we have created designated Provider Care Teams.  These Care Teams include your primary Cardiologist (physician) and Advanced Practice Providers (APPs -  Physician Assistants and Nurse Practitioners) who all work together to provide you with the care you need, when you need it.  We recommend signing up for the patient portal called "MyChart".  Sign up information is provided on this After Visit Summary.  MyChart is used to connect with patients for Virtual Visits (Telemedicine).  Patients are able to view lab/test results, encounter notes, upcoming appointments, etc.  Non-urgent messages can be sent to your provider as well.   To learn more about what you can do with MyChart, go to [email or web address].    Your next appointment:   3 month(s)  The format for your next appointment:   In Person  Provider:   Berniece Salines, DO   Other Instructions  Please call us back to schedule cath.      Adopting a Healthy Lifestyle.  Know what a healthy weight is for you (roughly BMI <25) and aim to maintain this   Aim for 7+ servings of fruits and vegetables daily   65-80+ fluid ounces of water or unsweet tea for healthy kidneys   Limit to max 1 drink of alcohol per day; avoid smoking/tobacco   Limit animal fats in diet for cholesterol and heart health - choose grass fed whenever available   Avoid highly processed foods, and foods high in saturated/trans fats  Aim for low stress - take time to unwind and care for your mental health   Aim for 150 min of moderate intensity exercise weekly for heart health, and weights twice weekly for bone health   Aim for 7-9 hours of sleep daily   When it comes to diets, agreement about the perfect plan isnt easy to find, even among the experts. Experts at the [employer] developed an idea known as the Healthy Eating Plate. Just imagine a plate divided into  logical, healthy portions.   The emphasis is on diet quality:   Load up on vegetables and fruits - one-half of your plate: Aim for color and variety, and remember that potatoes dont count.   Go for whole grains - one-quarter of your plate: Whole wheat, barley, wheat berries, quinoa, oats, brown rice, and foods made with them. If you want pasta, go with whole wheat pasta.   Protein power - one-quarter of your plate: Fish, chicken, beans, and nuts are all healthy, versatile protein sources. Limit red meat.   The diet, however, does go beyond the plate, offering a few other suggestions.   Use healthy plant oils, such as olive, canola, soy, corn, sunflower and peanut. Check the labels, and avoid partially hydrogenated oil, which have unhealthy trans fats.   If youre thirsty, drink water. Coffee and tea are good in moderation, but skip sugary drinks and limit milk and dairy products to one or two daily servings.   The type of carbohydrate in the diet is more important than the amount. Some sources of carbohydrates, such as vegetables, fruits, whole grains, and beans-are healthier than others.   Finally, stay active  Signed, Berniece Salines, DO  12/28/2019 10:59 PM    [facility]

## 2019-12-28 NOTE — Telephone Encounter (Signed)
Patient states he was supposed to get a list of 3 surgeons recommended for his catheterization . He states he would like a Armed forces operational officer.

## 2019-12-29 LAB — CBC
Hematocrit: 42.8 % (ref 37.5–51.0)
Hemoglobin: 14.3 g/dL (ref 13.0–17.7)
MCH: 30.2 pg (ref 26.6–33.0)
MCHC: 33.4 g/dL (ref 31.5–35.7)
MCV: 91 fL (ref 79–97)
Platelets: 272 10*3/uL (ref 150–450)
RBC: 4.73 x10E6/uL (ref 4.14–5.80)
RDW: 13.2 % (ref 11.6–15.4)
WBC: 9 10*3/uL (ref 3.4–10.8)

## 2019-12-31 ENCOUNTER — Telehealth: Payer: Self-pay | Admitting: Cardiology

## 2019-12-31 ENCOUNTER — Telehealth: Payer: Self-pay

## 2019-12-31 NOTE — Telephone Encounter (Signed)
Patient returning call.

## 2019-12-31 NOTE — Telephone Encounter (Signed)
Spoke with the pt and he verbalized understanding if his cath instructions.

## 2019-12-31 NOTE — Telephone Encounter (Signed)
Spoke with patient regarding results and recommendation.  Patient verbalizes understanding and is agreeable to plan of care. Advised patient to call back with any issues or concerns.  

## 2019-12-31 NOTE — Telephone Encounter (Signed)
Patient states he is returning a call to schedule his cardiac cath.

## 2019-12-31 NOTE — Telephone Encounter (Signed)
-----   Message from Berniece Salines, DO sent at 12/30/2019  7:58 PM EDT ----- Labs normal, hemoglobin normal.

## 2019-12-31 NOTE — Telephone Encounter (Signed)
Left message for the pt to call back.

## 2019-12-31 NOTE — Telephone Encounter (Signed)
Pt received Cath information through My Chart and reported to Dr. Terrial Rhodes nurse that he received it and understand his instructions. Will close out this encounter.

## 2020-01-02 ENCOUNTER — Other Ambulatory Visit (HOSPITAL_COMMUNITY)
Admission: RE | Admit: 2020-01-02 | Discharge: 2020-01-02 | Disposition: A | Discharge status: Home / Self Care | Payer: Medicare HMO | Source: Ambulatory Visit | Attending: Cardiovascular Disease | Admitting: Cardiovascular Disease

## 2020-01-02 DIAGNOSIS — Z20822 Contact with and (suspected) exposure to covid-19: Secondary | ICD-10-CM | POA: Diagnosis not present

## 2020-01-02 DIAGNOSIS — Z01812 Encounter for preprocedural laboratory examination: Secondary | ICD-10-CM | POA: Diagnosis present

## 2020-01-02 LAB — SARS CORONAVIRUS 2 (TAT 6-24 HRS): SARS Coronavirus 2: NEGATIVE

## 2020-01-03 ENCOUNTER — Ambulatory Visit: Payer: Medicare HMO | Admitting: Cardiology

## 2020-01-03 ENCOUNTER — Telehealth: Payer: Self-pay | Admitting: *Deleted

## 2020-01-03 NOTE — Telephone Encounter (Addendum)
Pt contacted pre-catheterization scheduled at [facility] for: Friday January 04, 2020 7:30 AM Verified arrival time and place: [place] [facility]) at: 5:30 AM   No solid food after midnight prior to cath, clear liquids until 5 AM day of procedure.  Hold:  Hydrochlorothiazide/Potassium-morning of procedure No diabetes medications the morning of the procedure including:  -Metformin-day of procedure and 48 hours after procedure  -Glyxambi-AM of procedure  -Glipizide-AM of procedure   Do not use Cialis until after procedure.  Except hold medications AM meds can be  taken pre-cath with sips of water including: ASA 81 mg   Confirmed patient has responsible adult to drive home post procedure and be with patient first 24 hours after arriving home: yes  You are allowed ONE visitor in the waiting room during the time you are at the hospital for your procedure. Both you and your visitor must wear a mask once you enter the hospital.       COVID-19 Pre-Screening Questions:  . In the past 14 days have you had a new cough, new headache, new nasal congestion, fever (100.4 or greater) unexplained body aches, new sore throat, or sudden loss of taste or sense of smell? no . In the past 14 days have you been around anyone with known Covid 19? no . Have you been vaccinated for COVID-19? Yes, see immunization history   Reviewed procedure/mask/visitor instructions, COVID-19 questions with patient.

## 2020-01-04 ENCOUNTER — Other Ambulatory Visit: Payer: Self-pay

## 2020-01-04 ENCOUNTER — Encounter (HOSPITAL_COMMUNITY): Payer: Self-pay | Admitting: Cardiovascular Disease

## 2020-01-04 ENCOUNTER — Ambulatory Visit (HOSPITAL_COMMUNITY)
Admission: RE | Admit: 2020-01-04 | Discharge: 2020-01-04 | Disposition: A | Discharge status: Home / Self Care | Payer: Medicare HMO | Attending: Cardiovascular Disease | Admitting: Cardiovascular Disease

## 2020-01-04 ENCOUNTER — Encounter (HOSPITAL_COMMUNITY)
Admission: RE | Disposition: A | Discharge status: Home / Self Care | Payer: Self-pay | Source: Home / Self Care | Attending: Cardiovascular Disease

## 2020-01-04 DIAGNOSIS — Z7984 Long term (current) use of oral hypoglycemic drugs: Secondary | ICD-10-CM | POA: Insufficient documentation

## 2020-01-04 DIAGNOSIS — F329 Major depressive disorder, single episode, unspecified: Secondary | ICD-10-CM | POA: Diagnosis not present

## 2020-01-04 DIAGNOSIS — R931 Abnormal findings on diagnostic imaging of heart and coronary circulation: Secondary | ICD-10-CM

## 2020-01-04 DIAGNOSIS — I2510 Atherosclerotic heart disease of native coronary artery without angina pectoris: Secondary | ICD-10-CM | POA: Diagnosis present

## 2020-01-04 DIAGNOSIS — Z7982 Long term (current) use of aspirin: Secondary | ICD-10-CM | POA: Insufficient documentation

## 2020-01-04 DIAGNOSIS — E559 Vitamin D deficiency, unspecified: Secondary | ICD-10-CM | POA: Insufficient documentation

## 2020-01-04 DIAGNOSIS — Z6828 Body mass index (BMI) 28.0-28.9, adult: Secondary | ICD-10-CM | POA: Diagnosis not present

## 2020-01-04 DIAGNOSIS — Z79899 Other long term (current) drug therapy: Secondary | ICD-10-CM | POA: Insufficient documentation

## 2020-01-04 DIAGNOSIS — E782 Mixed hyperlipidemia: Secondary | ICD-10-CM | POA: Diagnosis not present

## 2020-01-04 DIAGNOSIS — E669 Obesity, unspecified: Secondary | ICD-10-CM | POA: Insufficient documentation

## 2020-01-04 DIAGNOSIS — E119 Type 2 diabetes mellitus without complications: Secondary | ICD-10-CM | POA: Insufficient documentation

## 2020-01-04 DIAGNOSIS — I10 Essential (primary) hypertension: Secondary | ICD-10-CM | POA: Diagnosis not present

## 2020-01-04 HISTORY — PX: LEFT HEART CATH AND CORONARY ANGIOGRAPHY: CATH118249

## 2020-01-04 LAB — GLUCOSE, CAPILLARY: Glucose-Capillary: 126 mg/dL — ABNORMAL HIGH (ref 70–99)

## 2020-01-04 SURGERY — LEFT HEART CATH AND CORONARY ANGIOGRAPHY
Anesthesia: LOCAL

## 2020-01-04 MED ORDER — MIDAZOLAM HCL 2 MG/2ML IJ SOLN
INTRAMUSCULAR | Status: AC
  Filled 2020-01-04: qty 2

## 2020-01-04 MED ORDER — SODIUM CHLORIDE 0.9 % IV SOLN: 250.0000 mL | INTRAVENOUS | Status: DC | PRN

## 2020-01-04 MED ORDER — SODIUM CHLORIDE 0.9% FLUSH: 3.0000 mL | INTRAVENOUS | Status: DC | PRN

## 2020-01-04 MED ORDER — LABETALOL HCL 5 MG/ML IV SOLN: 10.0000 mg | INTRAVENOUS | Status: DC | PRN

## 2020-01-04 MED ORDER — ASPIRIN 81 MG PO CHEW: 81.0000 mg | CHEWABLE_TABLET | ORAL | Status: DC

## 2020-01-04 MED ORDER — SODIUM CHLORIDE 0.9% FLUSH: 3.0000 mL | Freq: Two times a day (BID) | INTRAVENOUS | Status: DC

## 2020-01-04 MED ORDER — FENTANYL CITRATE (PF) 100 MCG/2ML IJ SOLN
INTRAMUSCULAR | Status: AC
  Filled 2020-01-04: qty 2

## 2020-01-04 MED ORDER — SODIUM CHLORIDE 0.9 % IV SOLN: INTRAVENOUS | Status: AC

## 2020-01-04 MED ORDER — VERAPAMIL HCL 2.5 MG/ML IV SOLN
INTRAVENOUS | Status: AC
  Filled 2020-01-04: qty 2

## 2020-01-04 MED ORDER — HYDRALAZINE HCL 20 MG/ML IJ SOLN: 10.0000 mg | INTRAMUSCULAR | Status: DC | PRN

## 2020-01-04 MED ORDER — FENTANYL CITRATE (PF) 100 MCG/2ML IJ SOLN
INTRAMUSCULAR | Status: DC | PRN
Start: 2020-01-04 — End: 2020-01-04
  Administered 2020-01-04: 25 ug via INTRAVENOUS

## 2020-01-04 MED ORDER — MIDAZOLAM HCL 2 MG/2ML IJ SOLN
INTRAMUSCULAR | Status: DC | PRN
  Administered 2020-01-04: 1 mg via INTRAVENOUS

## 2020-01-04 MED ORDER — ONDANSETRON HCL 4 MG/2ML IJ SOLN: 4.0000 mg | Freq: Four times a day (QID) | INTRAMUSCULAR | Status: DC | PRN

## 2020-01-04 MED ORDER — HEPARIN (PORCINE) IN NACL 1000-0.9 UT/500ML-% IV SOLN
INTRAVENOUS | Status: AC
  Filled 2020-01-04: qty 1000

## 2020-01-04 MED ORDER — LIDOCAINE HCL (PF) 1 % IJ SOLN
INTRAMUSCULAR | Status: DC | PRN
  Administered 2020-01-04: 2 mL

## 2020-01-04 MED ORDER — VERAPAMIL HCL 2.5 MG/ML IV SOLN: INTRAVENOUS | Status: DC | PRN

## 2020-01-04 MED ORDER — HEPARIN SODIUM (PORCINE) 1000 UNIT/ML IJ SOLN
INTRAMUSCULAR | Status: AC
  Filled 2020-01-04: qty 1

## 2020-01-04 MED ORDER — HEPARIN SODIUM (PORCINE) 1000 UNIT/ML IJ SOLN
INTRAMUSCULAR | Status: DC | PRN
  Administered 2020-01-04: 5000 [IU] via INTRAVENOUS

## 2020-01-04 MED ORDER — SODIUM CHLORIDE 0.9 % WEIGHT BASED INFUSION: 1.0000 mL/kg/h | INTRAVENOUS | Status: DC

## 2020-01-04 MED ORDER — ACETAMINOPHEN 325 MG PO TABS: 650.0000 mg | ORAL_TABLET | ORAL | Status: DC | PRN

## 2020-01-04 MED ORDER — LIDOCAINE HCL (PF) 1 % IJ SOLN
INTRAMUSCULAR | Status: AC
  Filled 2020-01-04: qty 30

## 2020-01-04 MED ORDER — IOHEXOL 350 MG/ML SOLN
INTRAVENOUS | Status: DC | PRN
  Administered 2020-01-04: 45 mL

## 2020-01-04 MED ORDER — SODIUM CHLORIDE 0.9 % WEIGHT BASED INFUSION
3.0000 mL/kg/h | INTRAVENOUS | Status: AC
  Administered 2020-01-04: 3 mL/kg/h via INTRAVENOUS

## 2020-01-04 MED ORDER — HEPARIN (PORCINE) IN NACL 1000-0.9 UT/500ML-% IV SOLN
INTRAVENOUS | Status: DC | PRN
  Administered 2020-01-04 (×2): 500 mL

## 2020-01-04 SURGICAL SUPPLY — 9 items

## 2020-01-04 NOTE — Progress Notes (Signed)
Arm board applied to right arm

## 2020-01-04 NOTE — Progress Notes (Signed)
Ambulated to the bathroom to void tol well  

## 2020-01-04 NOTE — Discharge Instructions (Signed)
Hold metformin 48 hours post cath.    Drink plenty of fluids Keep right arm at or above heart level  Radial Site Care  This sheet gives you information about how to care for yourself after your procedure. Your health care provider may also give you more specific instructions. If you have problems or questions, contact your health care provider. What can I expect after the procedure? After the procedure, it is common to have:  Bruising and tenderness at the catheter insertion area. Follow these instructions at home: Medicines  Take over-the-counter and prescription medicines only as told by your health care provider. Insertion site care  Follow instructions from your health care provider about how to take care of your insertion site. Make sure you: ? Wash your hands with soap and water before you change your bandage (dressing). If soap and water are not available, use hand sanitizer. ? Change your dressing as told by your health care provider. ? Leave stitches (sutures), skin glue, or adhesive strips in place. These skin closures may need to stay in place for 2 weeks or longer. If adhesive strip edges start to loosen and curl up, you may trim the loose edges. Do not remove adhesive strips completely unless your health care provider tells you to do that.  Check your insertion site every day for signs of infection. Check for: ? Redness, swelling, or pain. ? Fluid or blood. ? Pus or a bad smell. ? Warmth.  Do not take baths, swim, or use a hot tub until your health care provider approves.  You may shower 24-48 hours after the procedure, or as directed by your health care provider. ? Remove the dressing and gently wash the site with plain soap and water. ? Pat the area dry with a clean towel. ? Do not rub the site. That could cause bleeding.  Do not apply powder or lotion to the site. Activity   For 24 hours after the procedure, or as directed by your health care provider: ? Do not  flex or bend the affected arm. ? Do not push or pull heavy objects with the affected arm. ? Do not drive yourself home from the hospital or clinic. You may drive 24 hours after the procedure unless your health care provider tells you not to. ? Do not operate machinery or power tools.  Do not lift anything that is heavier than 10 lb (4.5 kg), or the limit that you are told, until your health care provider says that it is safe.  Ask your health care provider when it is okay to: ? Return to work or school. ? Resume usual physical activities or sports. ? Resume sexual activity. General instructions  If the catheter site starts to bleed, raise your arm and put firm pressure on the site. If the bleeding does not stop, get help right away. This is a medical emergency.  If you went home on the same day as your procedure, a responsible adult should be with you for the first 24 hours after you arrive home.  Keep all follow-up visits as told by your health care provider. This is important. Contact a health care provider if:  You have a fever.  You have redness, swelling, or yellow drainage around your insertion site. Get help right away if:  You have unusual pain at the radial site.  The catheter insertion area swells very fast.  The insertion area is bleeding, and the bleeding does not stop when you hold steady  pressure on the area.  Your arm or hand becomes pale, cool, tingly, or numb. These symptoms may represent a serious problem that is an emergency. Do not wait to see if the symptoms will go away. Get medical help right away. Call your local emergency services (911 in the [place] Do not drive yourself to the hospital. Summary  After the procedure, it is common to have bruising and tenderness at the site.  Follow instructions from your health care provider about how to take care of your radial site wound. Check the wound every day for signs of infection.  Do not lift anything that is  heavier than 10 lb (4.5 kg), or the limit that you are told, until your health care provider says that it is safe. This information is not intended to replace advice given to you by your health care provider. Make sure you discuss any questions you have with your health care provider. Document Revised: 04/06/2017 Document Reviewed: 04/06/2017 Elsevier Patient Education  2020 [employer].

## 2020-01-04 NOTE — Progress Notes (Signed)
Discharge instructions reviewed with pt and his wife (via telephone) both voice understanding.  

## 2020-01-04 NOTE — Interval H&P Note (Signed)
History and Physical Interval Note:  01/04/2020 7:23 AM  Rhona Raider  has presented today for surgery, with the diagnosis of cad.  The various methods of treatment have been discussed with the patient and family. After consideration of risks, benefits and other options for treatment, the patient has consented to  Procedure(s): LEFT HEART CATH AND CORONARY ANGIOGRAPHY (N/A) as a surgical intervention.  The patient's history has been reviewed, patient examined, no change in status, stable for surgery.  I have reviewed the patient's chart and labs.  Questions were answered to the patient's satisfaction.    Cath Lab Visit (complete for each Cath Lab visit)  Clinical Evaluation Leading to the Procedure:   ACS: No.  Non-ACS:    Anginal Classification: No Symptoms  Anti-ischemic medical therapy: Minimal Therapy (1 class of medications)  Non-Invasive Test Results: High-risk stress test findings: cardiac mortality >3%/year Coronary CTA with possible severe LAD stenosis  Prior CABG: No previous CABG        Lauree Chandler

## 2020-01-09 ENCOUNTER — Ambulatory Visit: Payer: Medicare HMO | Admitting: Cardiology

## 2020-02-05 ENCOUNTER — Ambulatory Visit: Payer: Medicare HMO | Admitting: Cardiology

## 2020-02-06 ENCOUNTER — Other Ambulatory Visit: Payer: Self-pay

## 2020-02-06 ENCOUNTER — Ambulatory Visit: Payer: Medicare HMO | Admitting: Cardiology

## 2020-02-06 ENCOUNTER — Encounter: Payer: Self-pay | Admitting: Cardiology

## 2020-02-06 VITALS — BP 126/66 | HR 60 | Ht 71.0 in | Wt 200.4 lb

## 2020-02-06 DIAGNOSIS — I2510 Atherosclerotic heart disease of native coronary artery without angina pectoris: Secondary | ICD-10-CM

## 2020-02-06 DIAGNOSIS — E782 Mixed hyperlipidemia: Secondary | ICD-10-CM | POA: Diagnosis not present

## 2020-02-06 DIAGNOSIS — I10 Essential (primary) hypertension: Secondary | ICD-10-CM | POA: Diagnosis not present

## 2020-02-06 DIAGNOSIS — E1100 Type 2 diabetes mellitus with hyperosmolarity without nonketotic hyperglycemic-hyperosmolar coma (NKHHC): Secondary | ICD-10-CM

## 2020-02-06 NOTE — Patient Instructions (Signed)
  Follow-Up: At [facility], you and your health needs are our priority.  As part of our continuing mission to provide you with exceptional heart care, we have created designated Provider Care Teams.  These Care Teams include your primary Cardiologist (physician) and Advanced Practice Providers (APPs -  Physician Assistants and Nurse Practitioners) who all work together to provide you with the care you need, when you need it.  We recommend signing up for the patient portal called "MyChart".  Sign up information is provided on this After Visit Summary.  MyChart is used to connect with patients for Virtual Visits (Telemedicine).  Patients are able to view lab/test results, encounter notes, upcoming appointments, etc.  Non-urgent messages can be sent to your provider as well.   To learn more about what you can do with MyChart, go to [email or web address].    Your next appointment:   6 month(s)  The format for your next appointment:   In Person  Provider:   Berniece Salines, DO

## 2020-02-06 NOTE — Progress Notes (Signed)
Cardiology Office Note:    Date:  02/06/2020   ID:  Ronald Cruz, DOB [birth year], MRN [id number]  PCP:  Bernerd Limbo, MD  Cardiologist:  Berniece Salines, DO  Electrophysiologist:  None   Referring MD: Bernerd Limbo, MD   I am doing fine  History of Present Illness:    Ronald Cruz is a 69 y.o. male with a hx of coronary artery disease status post left heart catheterization with no need for any PCI, his coronary artery disease was discovered after he had a coronary calcium scoring reporting 1944 and proceeded to a coronary CTA.  Other medical history include hypertension, hyperlipidemia, diabetes and subtle memory loss.  He did undergo left heart catheterization which showed mild to moderate nonobstructive coronary artery disease.  He is here today he does not have any symptoms.  Past Medical History:  Diagnosis Date  . Acute pain of left shoulder 03/30/2017  . Allergic rhinitis 08/19/2012  . ASCVD (arteriosclerotic cardiovascular disease) 10/24/2019  . Cervical spondylosis 07/20/2010  . Chronic midline low back pain without sciatica 08/28/2019   Last Assessment & Plan:  Formatting of this note might be different from the original. This pain is chronic, but he does not take medication, nor have any radicular symptoms.  Because he can have multiple diagnostic imaging studies performed on the same day for a single co-pay, he desires MRI of the lumbar spine to be scheduled along with previously planned MRI of the brain.  MRI and plain films o  . Contact dermatitis and eczema due to plant 06/28/2012   Formatting of this note might be different from the original. Overview:  IMPRESSION: Topical itch creams advised also.  Steroid will affect blood sugar of next few days. Formatting of this note might be different from the original. IMPRESSION: Topical itch creams advised also.  Steroid will affect blood sugar of next few days. Formatting of this note might be different from the original. Overview:  .  Depressive disorder 08/19/2012  . 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 Overview:  IMPRESSION: Pt's Levitra has not worked. We will change to Viagra. Welbutrin usually does not affect erection. It could be more a result of his DM Formatting of this note might be different from the original. IMPRESSION: Pt's Levitra has not worked. We will change to Viagra. Welbutrin usually does not affect erection. It coul  . Essential hypertension 08/19/2012  . Herpes zoster 05/02/2013   Formatting of this note might be different from the original. Overview:  STORY: rash since sunday, post 72 hours window for antiviral therapy. `E1o3L`pain described is consistant with shingles, the rash is not vesicular, but has been scratching areas.`E1o3L`IMPRESSION: pain bearable. will watch and start neurontin if needed Formatting of this note might be different from the original. STORY: rash   . Hyperlipidemia 10/24/2019  . Lump in neck 08/28/2019   Formatting of this note might be different from the original. RIGHT, just posterior to the angle of the mandible.  Nontender.  Soft and mobile.  Last Assessment & Plan:  Formatting of this note might be different from the original. Right, just posterior to the angle of the mandible.  Nontender.  Soft and mobile.  This is not concerning for neoplasm.  . Memory changes 08/21/2019  . Mixed hyperlipidemia 05/02/2013   Formatting of this note might be different from the original. Overview:  IMPRESSION: The goal is to have your total cholesterol <  200, the HDL (good cholesterol) >40, and the LDL (bad cholesterol) <100.`E1o3L`It is recomended that you follow a good low fat diet and exercise for 30 minutes 3-4 times a week. Formatting of this note might be different from the original. IMPRESSION: The goal is to hav  . Obesity 05/02/2013  . Obesity (BMI 30-39.9) 10/24/2019  . Onychomycosis 12/31/2013  . 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 Pain of left hand 03/30/2017  . Pain of right shoulder joint on movement 03/30/2017  . 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 Screening for colon cancer 10/17/2015  . Screening for eye condition 05/02/2013  . Tendinitis of left shoulder 09/25/2014  . Tinea corporis 11/04/2016  . Trigger finger 11/23/2017  . Type 2 diabetes mellitus without complication, without long-term current use of insulin ([place]) 10/24/2019  . Type II diabetes mellitus ([place]) 05/02/2013   Formatting of this note might be different from the original. Overview:  IMPRESSION: The goal is for the Hgb A1C to be less than 7.0.`E1o3L`It is recommended that all diatetics are educated on and follow a healthy diabetic diet, exercise for 30 minutes 3-4 times per week (walking, biking, swimming, or machine), monitor blood glucose readings and bring that record with you to be reviewed at your ne  . Vitamin D deficiency 08/18/2011   Formatting of this note might be different from the original. Overview:  IMPRESSION: patient taking Vitamin D 2000IU per day. His levels are normal. Continue same dose Formatting of this note might be different from the original. IMPRESSION: patient taking Vitamin D 2000IU per day. His levels are normal. Continue same dose Formatting of this note might be different from the original. Overview:  Ov    Past Surgical History:  Procedure Laterality Date  . HAND SURGERY Right    Thumb  . HERNIA REPAIR    . LEFT HEART CATH AND CORONARY ANGIOGRAPHY N/A 01/04/2020   Procedure: LEFT HEART CATH AND CORONARY ANGIOGRAPHY;  Surgeon: Burnell Blanks, MD;  Location: [facility];  Service: Cardiovascular;  Laterality: N/A;    Current Medications: Current Meds  Medication Sig  . amLODipine (NORVASC) 5 MG tablet Take 1 tablet (5 mg total) by mouth daily.  . Ascorbic Acid (VITAMIN C) 1000 MG tablet Take 1,000 mg by mouth every evening.   Marland Kitchen aspirin EC 81 MG tablet Take 81 mg by mouth  daily. Swallow whole.  . benazepril (LOTENSIN) 20 MG tablet Take 20 mg by mouth daily.  . cyanocobalamin 1000 MCG tablet Take 1,000 mcg by mouth in the morning and at bedtime.   . Empagliflozin-linaGLIPtin (GLYXAMBI) 25-5 MG TABS Take 1 tablet by mouth daily.  . furosemide (LASIX) 20 MG tablet Take 20 mg by mouth every Tuesday, Thursday, and Saturday at 6 PM.  . glipiZIDE (GLUCOTROL XL) 10 MG 24 hr tablet Take 10 mg by mouth in the morning and at bedtime.  . hydrochlorothiazide (HYDRODIURIL) 25 MG tablet Take 25 mg by mouth daily.  . metFORMIN (GLUCOPHAGE) 1000 MG tablet Take 1,000 mg by mouth 2 (two) times daily with a meal.  . potassium chloride SA (KLOR-CON) 20 MEQ tablet Take 20 mEq by mouth every Tuesday, Thursday, and Saturday at 6 PM.  . Probiotic Product (PROBIOTIC DAILY) CAPS Take 1 capsule by mouth daily. High Dose Probiotic  . rosuvastatin (CRESTOR) 20 MG tablet Take 1 tablet (20 mg total) by mouth daily.  . tadalafil (CIALIS) 10 MG tablet Take 10 mg by mouth daily as needed  for erectile dysfunction.   . traZODone (DESYREL) 50 MG tablet Take 50 mg by mouth at bedtime.  . Vitamin D, Ergocalciferol, 50 MCG (2000 UT) CAPS Take 2,000 Units by mouth daily.   . vitamin E 45 MG (100 UNITS) capsule Take 100 Units by mouth every evening.   . WHEAT GERM OIL PO Take 1 capsule by mouth in the morning and at bedtime.      Allergies:   Patient has no known allergies.   Social History   Socioeconomic History  . Marital status: Married    Spouse name: Not on file  . Number of children: Not on file  . Years of education: Not on file  . Highest education level: Not on file  Occupational History  . Not on file  Tobacco Use  . Smoking status: Never Smoker  . Smokeless tobacco: Never Used  Substance and Sexual Activity  . Alcohol use: Yes    Comment: beer once or twice a month  . Drug use: Never  . Sexual activity: Not on file  Other Topics Concern  . Not on file  Social History  Narrative  . Not on file   Social Determinants of Health   Financial Resource Strain:   . Difficulty of Paying Living Expenses: Not on file  Food Insecurity:   . Worried About Charity fundraiser in the Last Year: Not on file  . Ran Out of Food in the Last Year: Not on file  Transportation Needs:   . Lack of Transportation (Medical): Not on file  . Lack of Transportation (Non-Medical): Not on file  Physical Activity:   . Days of Exercise per Week: Not on file  . Minutes of Exercise per Session: Not on file  Stress:   . Feeling of Stress : Not on file  Social Connections:   . Frequency of Communication with Friends and Family: Not on file  . Frequency of Social Gatherings with Friends and Family: Not on file  . Attends Religious Services: Not on file  . Active Member of Clubs or Organizations: Not on file  . Attends Archivist Meetings: Not on file  . Marital Status: Not on file     Family History: The patient's family history includes Cancer in his paternal grandfather; Esophageal cancer in his mother; Stroke in his father.  ROS:   Review of Systems  Constitution: Negative for decreased appetite, fever and weight gain.  HENT: Negative for congestion, ear discharge, hoarse voice and sore throat.   Eyes: Negative for discharge, redness, vision loss in right eye and visual halos.  Cardiovascular: Negative for chest pain, dyspnea on exertion, leg swelling, orthopnea and palpitations.  Respiratory: Negative for cough, hemoptysis, shortness of breath and snoring.   Endocrine: Negative for heat intolerance and polyphagia.  Hematologic/Lymphatic: Negative for bleeding problem. Does not bruise/bleed easily.  Skin: Negative for flushing, nail changes, rash and suspicious lesions.  Musculoskeletal: Negative for arthritis, joint pain, muscle cramps, myalgias, neck pain and stiffness.  Gastrointestinal: Negative for abdominal pain, bowel incontinence, diarrhea and excessive  appetite.  Genitourinary: Negative for decreased libido, genital sores and incomplete emptying.  Neurological: Negative for brief paralysis, focal weakness, headaches and loss of balance.  Psychiatric/Behavioral: Negative for altered mental status, depression and suicidal ideas.  Allergic/Immunologic: Negative for HIV exposure and persistent infections.    EKGs/Labs/Other Studies Reviewed:    The following studies were reviewed today:   EKG: None today   Left catheterizationProx RCA lesion is  30% stenosed.  Ost LM to Mid LM lesion is 20% stenosed.  Ost Cx to Prox Cx lesion is 20% stenosed.  Dist LM to Prox LAD lesion is 30% stenosed.  LPDA lesion is 70% stenosed.   1. Mild distal left main stenosis. This lesion is calcified 2. Mild ostial LAD stenosis. This lesion is calcified 3. Mild ostial Circumflex stenosis. This lesion is calcified. The most distal small obtuse marginal branch has a moderate, eccentric stenosis.  4. The RCA is a small to moderate caliber non-dominant artery with mild plaque in the proximal and mid segments.   Recommendations: Medical management of mild non-obstructive CAD.   Recent Labs: 12/20/2019: Magnesium 1.7 12/26/2019: BUN 15; Creatinine, Ser 1.15; Potassium 3.6; Sodium 140 12/28/2019: Hemoglobin 14.3; Platelets 272  Recent Lipid Panel No results found for: CHOL, TRIG, HDL, CHOLHDL, VLDL, LDLCALC, LDLDIRECT  Physical Exam:    VS:  BP 126/66   Pulse 60   Ht 5\' 11"  (1.803 m)   Wt 200 lb 6.4 oz (90.9 kg)   SpO2 98%   BMI 27.95 kg/m     Wt Readings from Last 3 Encounters:  02/06/20 200 lb 6.4 oz (90.9 kg)  01/04/20 198 lb (89.8 kg)  12/28/19 206 lb (93.4 kg)     GEN: Well nourished, well developed in no acute distress HEENT: Normal NECK: No JVD; No carotid bruits LYMPHATICS: No lymphadenopathy CARDIAC: S1S2 noted,RRR, no murmurs, rubs, gallops RESPIRATORY:  Clear to auscultation without rales, wheezing or rhonchi  ABDOMEN: Soft,  non-tender, non-distended, +bowel sounds, no guarding. EXTREMITIES: No edema, No cyanosis, no clubbing MUSCULOSKELETAL:  No deformity  SKIN: Warm and dry NEUROLOGIC:  Alert and oriented x 3, non-focal PSYCHIATRIC:  Normal affect, good insight  ASSESSMENT:    1. Coronary artery disease involving native coronary artery of native heart without angina pectoris   2. Essential hypertension   3. Type 2 diabetes mellitus with hyperosmolarity without coma, without long-term current use of insulin ([place])   4. Mixed hyperlipidemia    PLAN:     He is doing well from a cardiovascular standpoint.  He is stable with no anginal symptoms.  We will continue the patient's current medication regimen. Hypertension blood pressure is acceptable no changes will be made. Hyperlipidemia continue patient on statin medication.  The patient is in agreement with the above plan. The patient left the office in stable condition.  The patient will follow up in 6 months or sooner as needed.   Medication Adjustments/Labs and Tests Ordered: Current medicines are reviewed at length with the patient today.  Concerns regarding medicines are outlined above.  No orders of the defined types were placed in this encounter.  No orders of the defined types were placed in this encounter.   Patient Instructions   Follow-Up: At [facility], you and your health needs are our priority.  As part of our continuing mission to provide you with exceptional heart care, we have created designated Provider Care Teams.  These Care Teams include your primary Cardiologist (physician) and Advanced Practice Providers (APPs -  Physician Assistants and Nurse Practitioners) who all work together to provide you with the care you need, when you need it.  We recommend signing up for the patient portal called "MyChart".  Sign up information is provided on this After Visit Summary.  MyChart is used to connect with patients for Virtual Visits  (Telemedicine).  Patients are able to view lab/test results, encounter notes, upcoming appointments, etc.  Non-urgent messages can  be sent to your provider as well.   To learn more about what you can do with MyChart, go to [email or web address].    Your next appointment:   6 month(s)  The format for your next appointment:   In Person  Provider:   Berniece Salines, DO       Adopting a Healthy Lifestyle.  Know what a healthy weight is for you (roughly BMI <25) and aim to maintain this   Aim for 7+ servings of fruits and vegetables daily   65-80+ fluid ounces of water or unsweet tea for healthy kidneys   Limit to max 1 drink of alcohol per day; avoid smoking/tobacco   Limit animal fats in diet for cholesterol and heart health - choose grass fed whenever available   Avoid highly processed foods, and foods high in saturated/trans fats   Aim for low stress - take time to unwind and care for your mental health   Aim for 150 min of moderate intensity exercise weekly for heart health, and weights twice weekly for bone health   Aim for 7-9 hours of sleep daily   When it comes to diets, agreement about the perfect plan isnt easy to find, even among the experts. Experts at the [employer] developed an idea known as the Healthy Eating Plate. Just imagine a plate divided into logical, healthy portions.   The emphasis is on diet quality:   Load up on vegetables and fruits - one-half of your plate: Aim for color and variety, and remember that potatoes dont count.   Go for whole grains - one-quarter of your plate: Whole wheat, barley, wheat berries, quinoa, oats, brown rice, and foods made with them. If you want pasta, go with whole wheat pasta.   Protein power - one-quarter of your plate: Fish, chicken, beans, and nuts are all healthy, versatile protein sources. Limit red meat.   The diet, however, does go beyond the plate, offering a few other suggestions.   Use  healthy plant oils, such as olive, canola, soy, corn, sunflower and peanut. Check the labels, and avoid partially hydrogenated oil, which have unhealthy trans fats.   If youre thirsty, drink water. Coffee and tea are good in moderation, but skip sugary drinks and limit milk and dairy products to one or two daily servings.   The type of carbohydrate in the diet is more important than the amount. Some sources of carbohydrates, such as vegetables, fruits, whole grains, and beans-are healthier than others.   Finally, stay active  Signed, Berniece Salines, DO  02/06/2020 9:31 AM    [place]

## 2020-02-28 ENCOUNTER — Ambulatory Visit: Payer: Medicare HMO | Admitting: Cardiology

## 2020-04-03 ENCOUNTER — Encounter: Payer: Self-pay | Admitting: Gastroenterology

## 2020-04-15 ENCOUNTER — Ambulatory Visit: Payer: Medicare HMO | Admitting: Cardiology

## 2020-05-08 ENCOUNTER — Ambulatory Visit: Payer: Medicare HMO | Admitting: Gastroenterology

## 2020-05-08 ENCOUNTER — Other Ambulatory Visit: Payer: Medicare HMO

## 2020-05-08 ENCOUNTER — Encounter: Payer: Self-pay | Admitting: Gastroenterology

## 2020-05-08 ENCOUNTER — Other Ambulatory Visit: Payer: Self-pay

## 2020-05-08 VITALS — BP 118/66 | HR 76 | Ht 71.0 in | Wt 198.1 lb

## 2020-05-08 DIAGNOSIS — R194 Change in bowel habit: Secondary | ICD-10-CM | POA: Diagnosis not present

## 2020-05-08 NOTE — Progress Notes (Signed)
[facility] Consult Note:  History: Ronald Cruz 05/08/2020  Referring provider: Bernerd Limbo, MD  Reason for consult/chief complaint: Diarrhea (Change in bowel habits that started 2 months ago.  Patient describes as "loose stools with metalic smells." Diarrhea has improved but the "smell of the stool is still foul.")   Subjective  HPI:  This is a very pleasant 70 year old man referred by primary care for a recent change in bowel habits. About 2 months ago his stool developed a "pudding consistency", and he does not recall any changes in medicines or diet or other clear triggers. He started taking in more dietary fiber on recommendation of primary care in the stool has now become formed. However, it is still "greasy" and is oily in the toilet and he feels it is difficult to get clean after bowel movement as a result.  He denies abdominal pain bleeding, gas or bloating. His weight has been stable. He was diagnosed with Covid on January 18 and was unable to return to work until he received a note from his PCP yesterday. He has noted that being away from work and his laptop have decreased his overall stress and he wonders if that could be related to the improvement in symptoms. 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 Ronald Cruz is concerned about the symptoms because he has upcoming international travel related to his work. The change in bowel habits was initially noted prior to the Covid infection  Ronald Cruz underwent a colonoscopy sometime in the last few years as best he can recall, and it sounds like it may have been with the [facility]. He had a least one previous colonoscopy with him as well.  Primary care office visit note from 03/24/2020 indicates patient reporting several weeks of loose, oily foul-smelling stool.  No apparent changes in diet or medicines that may have accounted for it.  Increasing dietary fiber  reportedly help this problem. Records indicate a stable regimen of diabetic medicines for years including Januvia and Jardiance, and that glyxambi was discontinued several months ago due to expense, after which he was started on pioglitazone.  That medicine then cause significant side effects including weight gain and lower extremity edema with higher average glucose levels as well.  The patient then discontinued pioglitazone and went back to [facility]. CBC, CMP, hemoglobin A1c and fecal fat were ordered (those results are not included with the records) Primary care note also indicates the patient's frequent concerns about his health.  Ronald Cruz has report of fecal fat : " Normal"  Ronald Cruz is concerned because he has had diabetes since 2005, has been on medicines for that over many years and is concerned "about what these medicines may be doing to my organs". He felt that an MRI of the liver pancreas and kidneys would be helpful to do a general assessment of those organs and to explore the nature of this change in bowel habits.   ROS:  Review of Systems  Constitutional: Negative for appetite change and unexpected weight change.  HENT: Negative for mouth sores and voice change.   Eyes: Negative for pain and redness.  Respiratory: Negative for cough and shortness of breath.   Cardiovascular: Negative for chest pain and palpitations.  Genitourinary: Negative for dysuria and hematuria.  Musculoskeletal: Negative for arthralgias and myalgias.  Skin: Negative for pallor and rash.  Neurological: Negative for weakness and headaches.  Hematological: Negative for adenopathy.  Past Medical History: Past Medical History:  Diagnosis Date  . Acute pain of left shoulder 03/30/2017  . Allergic rhinitis 08/19/2012  . ASCVD (arteriosclerotic cardiovascular disease) 10/24/2019  . Cervical spondylosis 07/20/2010  . Chronic midline low back pain without sciatica 08/28/2019   Last Assessment & Plan:  Formatting of  this note might be different from the original. This pain is chronic, but he does not take medication, nor have any radicular symptoms.  Because he can have multiple diagnostic imaging studies performed on the same day for a single co-pay, he desires MRI of the lumbar spine to be scheduled along with previously planned MRI of the brain.  MRI and plain films o  . Contact dermatitis and eczema due to plant 06/28/2012   Formatting of this note might be different from the original. Overview:  IMPRESSION: Topical itch creams advised also.  Steroid will affect blood sugar of next few days. Formatting of this note might be different from the original. IMPRESSION: Topical itch creams advised also.  Steroid will affect blood sugar of next few days. Formatting of this note might be different from the original. Overview:  . Depressive disorder 08/19/2012  . Diabetes ([place])   . 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 Overview:  IMPRESSION: Pt's Levitra has not worked. We will change to Viagra. Welbutrin usually does not affect erection. It could be more a result of his DM Formatting of this note might be different from the original. IMPRESSION: Pt's Levitra has not worked. We will change to Viagra. Welbutrin usually does not affect erection. It coul  . Essential hypertension 08/19/2012  . Herpes zoster 05/02/2013   Formatting of this note might be different from the original. Overview:  STORY: rash since sunday, post 72 hours window for antiviral therapy. `E1o3L`pain described is consistant with shingles, the rash is not vesicular, but has been scratching areas.`E1o3L`IMPRESSION: pain bearable. will watch and start neurontin if needed Formatting of this note might be different from the original. STORY: rash   . Hyperlipidemia 10/24/2019  . Insomnia   . Lump in neck 08/28/2019   Formatting of this note might be different from the original. RIGHT,  just posterior to the angle of the mandible.  Nontender.  Soft and mobile.  Last Assessment & Plan:  Formatting of this note might be different from the original. Right, just posterior to the angle of the mandible.  Nontender.  Soft and mobile.  This is not concerning for neoplasm.  . Memory changes 08/21/2019  . Mixed hyperlipidemia 05/02/2013   Formatting of this note might be different from the original. Overview:  IMPRESSION: The goal is to have your total cholesterol < 200, the HDL (good cholesterol) >40, and the LDL (bad cholesterol) <100.`E1o3L`It is recomended that you follow a good low fat diet and exercise for 30 minutes 3-4 times a week. Formatting of this note might be different from the original. IMPRESSION: The goal is to hav  . Obesity 05/02/2013  . Obesity (BMI 30-39.9) 10/24/2019  . Onychomycosis 12/31/2013  . 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 Pain of left hand 03/30/2017  . Pain of right shoulder joint on movement 03/30/2017  . 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 Screening for colon cancer 10/17/2015  . Screening for eye condition 05/02/2013  . Tendinitis of left shoulder 09/25/2014  . Tinea corporis 11/04/2016  . Trigger finger 11/23/2017  . Type 2  diabetes mellitus without complication, without long-term current use of insulin ([place]) 10/24/2019  . Type II diabetes mellitus ([place]) 05/02/2013   Formatting of this note might be different from the original. Overview:  IMPRESSION: The goal is for the Hgb A1C to be less than 7.0.`E1o3L`It is recommended that all diatetics are educated on and follow a healthy diabetic diet, exercise for 30 minutes 3-4 times per week (walking, biking, swimming, or machine), monitor blood glucose readings and bring that record with you to be reviewed at your ne  . Vitamin D deficiency 08/18/2011   Formatting of this note might be different from the original. Overview:  IMPRESSION: patient taking Vitamin D 2000IU per day. His levels are normal.  Continue same dose Formatting of this note might be different from the original. IMPRESSION: patient taking Vitamin D 2000IU per day. His levels are normal. Continue same dose Formatting of this note might be different from the original. Overview:  Ov     Past Surgical History: Past Surgical History:  Procedure Laterality Date  . HAND SURGERY Right    Thumb  . HERNIA REPAIR    . LEFT HEART CATH AND CORONARY ANGIOGRAPHY N/A 01/04/2020   Procedure: LEFT HEART CATH AND CORONARY ANGIOGRAPHY;  Surgeon: Burnell Blanks, MD;  Location: [facility];  Service: Cardiovascular;  Laterality: N/A;     Family History: Family History  Problem Relation Age of Onset  . Esophageal cancer Mother   . Stroke Father   . Diabetes Father   . Irritable bowel syndrome Father   . Diverticulosis Father   . Irritable bowel syndrome Sister   . Diverticulosis Sister   . Cancer Paternal Grandfather   . Diverticulosis Sister   . Diabetes Maternal Uncle   . Diabetes Paternal Aunt   . Diabetes Paternal Uncle   . Pancreatic cancer Maternal Grandfather   . Diabetes Maternal Uncle   . Colon cancer Neg Hx   . Stomach cancer Neg Hx     Social History: Social History   Socioeconomic History  . Marital status: Married    Spouse name: Not on file  . Number of children: Not on file  . Years of education: Not on file  . Highest education level: Not on file  Occupational History  . Not on file  Tobacco Use  . Smoking status: Never Smoker  . Smokeless tobacco: Never Used  Substance and Sexual Activity  . Alcohol use: Yes    Comment: beer once or twice a month  . Drug use: Never  . Sexual activity: Not on file  Other Topics Concern  . Not on file  Social History Narrative  . 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 Ascorbic Acid (VITAMIN C) 1000 MG tablet Take 1,000 mg by mouth every evening.     Marland Kitchen aspirin EC 81 MG tablet Take 81 mg by mouth daily. Swallow whole.    . benazepril (LOTENSIN) 20 MG tablet Take 20 mg by mouth daily.    . cyanocobalamin 1000 MCG tablet Take 1,000 mcg by mouth in the  morning and at bedtime.     . Empagliflozin-linaGLIPtin (GLYXAMBI) 25-5 MG TABS Take 1 tablet by mouth daily.    . furosemide (LASIX) 20 MG tablet Take 20 mg by mouth every Tuesday, Thursday, and Saturday at 6 PM.    . glipiZIDE (GLUCOTROL XL) 10 MG 24 hr tablet Take 10 mg by mouth in the morning and at bedtime.    . hydrochlorothiazide (HYDRODIURIL) 25 MG tablet Take 25 mg by mouth daily.    . metFORMIN (GLUCOPHAGE) 1000 MG tablet Take 1,000 mg by mouth 2 (two) times daily with a meal.    . potassium chloride SA (KLOR-CON) 20 MEQ tablet Take 20 mEq by mouth every Tuesday, Thursday, and Saturday at 6 PM.    . rosuvastatin (CRESTOR) 20 MG tablet Take 1 tablet (20 mg total) by mouth daily. 90 tablet 3  . tadalafil (CIALIS) 10 MG tablet Take 10 mg by mouth daily as needed for erectile dysfunction.     . traZODone (DESYREL) 50 MG tablet Take 50 mg by mouth at bedtime.    . Vitamin D, Ergocalciferol, 50 MCG (2000 UT) CAPS Take 2,000 Units by mouth daily.     . vitamin E 45 MG (100 UNITS) capsule Take 100 Units by mouth every evening.     . WHEAT GERM OIL PO Take 1 capsule by mouth in the morning and at bedtime.      No current facility-administered medications for this visit.      ___________________________________________________________________ Objective   Exam:  BP 118/66 (BP Location: Left Arm, Patient Position: Sitting, Cuff Size: Normal)   Pulse 76   Ht 5\' 11"  (1.803 m)    Wt 198 lb 2 oz (89.9 kg)   BMI 27.63 kg/m  Wt Readings from Last 3 Encounters:  05/08/20 198 lb 2 oz (89.9 kg)  02/06/20 200 lb 6.4 oz (90.9 kg)  01/04/20 198 lb (89.8 kg)     General: Well-appearing, pleasant and conversational. Good muscle mass, well hydrated  Eyes: sclera anicteric, no redness  ENT: oral mucosa moist without lesions, no cervical or supraclavicular lymphadenopathy  CV: RRR without murmur, S1/S2, no JVD, no peripheral edema  Resp: clear to auscultation bilaterally, normal RR and effort noted  GI: soft, no tenderness, with active bowel sounds. No guarding or palpable organomegaly noted.  Skin; warm and dry, no rash or jaundice noted  Neuro: awake, alert and oriented x 3. Normal gross motor function and fluent speech  Labs: No additional data other than fecal fat study noted above  Assessment: Encounter Diagnosis  Name Primary?  . Change in bowel habits Yes    The nature of his change in bowel habits is unclear at present. He looks and feels well, and I reassured him I do not think this is likely to be something worrisome more harmful. It is also slowly getting better on its own. He has longstanding diabetes, so EPI is worth consideration. Seems unlikely be infection, that should be ruled out as well. I advised against abdominal imaging for the symptoms he is describing, as I think it is very likely to be of low yield and may reveal incidental findings of questionable clinical significance.  Plan:  Stool for ova and parasites and C. difficile testing Fecal elastase  We will contact him with the results and I will be glad to see him again if this does not resolve on its own or if he has ongoing concerns.  Thank you for the courtesy of this consult.  Please call me with any questions or concerns.  Nelida Meuse III  CC: Referring provider noted above

## 2020-05-08 NOTE — Patient Instructions (Signed)
If you are age 71 or older, your body mass index should be between 23-30. Your Body mass index is 27.63 kg/m. If this is out of the aforementioned range listed, please consider follow up with your Primary Care Provider.  If you are age 22 or younger, your body mass index should be between 19-25. Your Body mass index is 27.63 kg/m. If this is out of the aformentioned range listed, please consider follow up with your Primary Care Provider.   Your provider has requested that you go to the basement level for lab work before leaving today. Press "B" on the elevator. The lab is located at the first door on the left as you exit the elevator.  It was a pleasure to see you today!  Dr. [name]

## 2020-05-13 ENCOUNTER — Other Ambulatory Visit: Payer: Medicare HMO

## 2020-05-13 DIAGNOSIS — R194 Change in bowel habit: Secondary | ICD-10-CM

## 2020-05-18 LAB — C. DIFFICILE GDH AND TOXIN A/B
GDH ANTIGEN: NOT DETECTED
MICRO NUMBER:: 11592617
SPECIMEN QUALITY:: ADEQUATE
TOXIN A AND B: NOT DETECTED

## 2020-05-18 LAB — PANCREATIC ELASTASE, FECAL: Pancreatic Elastase-1, Stool: >500 mcg/g

## 2020-05-20 ENCOUNTER — Telehealth: Payer: Self-pay | Admitting: Gastroenterology

## 2020-05-20 NOTE — Telephone Encounter (Signed)
Lm on vm for patient to return call 

## 2020-05-20 NOTE — Telephone Encounter (Signed)
Spoke with patient in regards to recommendations per Dr. [name], patient repeated all of the information he provided to me earlier with his concerns of this issue going on for about 2 months, he is worried that he will have "an episode" while traveling. 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 Patient states that he will keep appointment tomorrow to discuss with Janett Billow and will schedule procedures at that time if it is recommended. Patient again voiced frustration about his condition and what has been done for him at this time.

## 2020-05-20 NOTE — Telephone Encounter (Signed)
Spoke with patient in regards to his stool study results, advised that Dr.Danis sent the message to him via My Chart which the patient reviewed. He expressed frustration because he wanted someone to call him with next steps and states that he has never had to reach out the doctor's office and "play doctor", he states that he expressed his extreme concerns during the office visit and feels like more can be done than the two stool tests that were ordered. Advised patient that based on last office note if he still had concerns or the same symptoms we would bring him back in for further assessment. Patient states that he needed to be seen ASAP by Dr. [name], next available is 07/07/20, patient cuts me off and states "No, not gonna work", I told the patient that we can schedule him with an APP for a sooner appt, he has been scheduled for an appt tomorrow with Alonza Bogus, PA at 2 PM.

## 2020-05-20 NOTE — Telephone Encounter (Signed)
I am sorry to hear he is unsatisfied.  I recall our lengthy conversation in the office, and I both understand and validate his concerns regarding the change in bowel habits.  My opinion remains that it is not likely to be the result of something harmful.  From the history he gave me, none of his medicines were new to explain it.  In my note you will also see that he expressed concerns about the need for abdominal imaging, but I still feel that is likely to be unrevealing for cause of what he is describing.  If Ronald Cruz would like further investigation, I am willing to schedule him for an upper endoscopy and a colonoscopy.  I will take some biopsies in both the small bowel and the colon and see if we discover anything that might explain this.  He should understand that those results might also be normal.  He described his stool being "greasy", so I recommend stopping any supplements that may be oils, such as the wheat germ oil listed with his meds.  If he would like to proceed thusly, the procedures can be directly booked or he can come to the appointment with Janett Billow tomorrow and do it. I have no further recommendations at this point.  - HD

## 2020-05-20 NOTE — Telephone Encounter (Signed)
Pt is requesting a call back from a nurse to discuss his lab results. 

## 2020-05-21 ENCOUNTER — Encounter: Payer: Self-pay | Admitting: Gastroenterology

## 2020-05-21 ENCOUNTER — Ambulatory Visit: Payer: Medicare HMO | Admitting: Gastroenterology

## 2020-05-21 VITALS — BP 110/74 | HR 94 | Ht 70.0 in | Wt 197.0 lb

## 2020-05-21 DIAGNOSIS — R194 Change in bowel habit: Secondary | ICD-10-CM | POA: Insufficient documentation

## 2020-05-21 MED ORDER — NA SULFATE-K SULFATE-MG SULF 17.5-3.13-1.6 GM/177ML PO SOLN: 1.0000 | Freq: Once | ORAL | 0 refills | Status: AC

## 2020-05-21 NOTE — Progress Notes (Signed)
05/21/2020 Ronald Cruz [id number] [birth year]   HISTORY OF PRESENT ILLNESS: This is a 70 year old male who is a patient of Dr. Corena Pilgrim, known to him only for an office visit on May 08, 2020.  Please see his office note from that date for further details.  He presented here with complaints of change in bowel habits with greasy, oily stools with increased frequency and foul odor.  He says that this has only been an issue for the past several months.  This is been very distressing to him and he says that he is very in tune with his body and knows that there is something going on.  He had a colonoscopy a few years ago at [facility].  We are going to try to obtain those records.  He had stool for C. difficile that was negative.  Fecal elastase was normal.  Fecal fat was normal.  All other basic labs unremarkable.  He has no other associated symptoms including rectal bleeding, abdominal pain, bloating, nausea, vomiting, weight loss, etc.   Past Medical History:  Diagnosis Date  . Acute pain of left shoulder 03/30/2017  . Allergic rhinitis 08/19/2012  . ASCVD (arteriosclerotic cardiovascular disease) 10/24/2019  . Cervical spondylosis 07/20/2010  . Chronic midline low back pain without sciatica 08/28/2019   Last Assessment & Plan:  Formatting of this note might be different from the original. This pain is chronic, but he does not take medication, nor have any radicular symptoms.  Because he can have multiple diagnostic imaging studies performed on the same day for a single co-pay, he desires MRI of the lumbar spine to be scheduled along with previously planned MRI of the brain.  MRI and plain films o  . Contact dermatitis and eczema due to plant 06/28/2012   Formatting of this note might be different from the original. Overview:  IMPRESSION: Topical itch creams advised also.  Steroid will affect blood sugar of next few days. Formatting of this note might be different from the original. IMPRESSION: Topical  itch creams advised also.  Steroid will affect blood sugar of next few days. Formatting of this note might be different from the original. Overview:  . Depressive disorder 08/19/2012  . Diabetes ([place])   . 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 Overview:  IMPRESSION: Pt's Levitra has not worked. We will change to Viagra. Welbutrin usually does not affect erection. It could be more a result of his DM Formatting of this note might be different from the original. IMPRESSION: Pt's Levitra has not worked. We will change to Viagra. Welbutrin usually does not affect erection. It coul  . Essential hypertension 08/19/2012  . Herpes zoster 05/02/2013   Formatting of this note might be different from the original. Overview:  STORY: rash since sunday, post 72 hours window for antiviral therapy. `E1o3L`pain described is consistant with shingles, the rash is not vesicular, but has been scratching areas.`E1o3L`IMPRESSION: pain bearable. will watch and start neurontin if needed Formatting of this note might be different from the original. STORY: rash   . Hyperlipidemia 10/24/2019  . Insomnia   . Lump in neck 08/28/2019   Formatting of this note might be different from the original. RIGHT, just posterior to the angle of the mandible.  Nontender.  Soft and mobile.  Last Assessment & Plan:  Formatting of this note might be different from the original. Right, just posterior to the angle of  the mandible.  Nontender.  Soft and mobile.  This is not concerning for neoplasm.  . Memory changes 08/21/2019  . Mixed hyperlipidemia 05/02/2013   Formatting of this note might be different from the original. Overview:  IMPRESSION: The goal is to have your total cholesterol < 200, the HDL (good cholesterol) >40, and the LDL (bad cholesterol) <100.`E1o3L`It is recomended that you follow a good low fat diet and exercise for 30 minutes 3-4 times a week. Formatting of this  note might be different from the original. IMPRESSION: The goal is to hav  . Obesity 05/02/2013  . Obesity (BMI 30-39.9) 10/24/2019  . Onychomycosis 12/31/2013  . 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 Pain of left hand 03/30/2017  . Pain of right shoulder joint on movement 03/30/2017  . 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 Screening for colon cancer 10/17/2015  . Screening for eye condition 05/02/2013  . Tendinitis of left shoulder 09/25/2014  . Tinea corporis 11/04/2016  . Trigger finger 11/23/2017  . Type 2 diabetes mellitus without complication, without long-term current use of insulin ([place]) 10/24/2019  . Type II diabetes mellitus ([place]) 05/02/2013   Formatting of this note might be different from the original. Overview:  IMPRESSION: The goal is for the Hgb A1C to be less than 7.0.`E1o3L`It is recommended that all diatetics are educated on and follow a healthy diabetic diet, exercise for 30 minutes 3-4 times per week (walking, biking, swimming, or machine), monitor blood glucose readings and bring that record with you to be reviewed at your ne  . Vitamin D deficiency 08/18/2011   Formatting of this note might be different from the original. Overview:  IMPRESSION: patient taking Vitamin D 2000IU per day. His levels are normal. Continue same dose Formatting of this note might be different from the original. IMPRESSION: patient taking Vitamin D 2000IU per day. His levels are normal. Continue same dose Formatting of this note might be different from the original. Overview:  Ov   Past Surgical History:  Procedure Laterality Date  . HAND SURGERY Right    Thumb  . HERNIA REPAIR    . LEFT HEART CATH AND CORONARY ANGIOGRAPHY N/A 01/04/2020   Procedure: LEFT HEART CATH AND CORONARY ANGIOGRAPHY;  Surgeon: Burnell Blanks, MD;  Location: [facility];  Service: Cardiovascular;  Laterality: N/A;    reports that he has never smoked. He has never used smokeless tobacco. He reports  current alcohol use. He reports that he does not use drugs. family history includes Cancer in his paternal grandfather; Diabetes in his father, maternal uncle, maternal uncle, paternal aunt, and paternal uncle; Diverticulosis in his father, sister, and sister; Esophageal cancer in his mother; Irritable bowel syndrome in his father and sister; Pancreatic cancer in his maternal grandfather; Stroke in his father. No Known Allergies    Outpatient Encounter Medications as of 05/21/2020  Medication Sig  . Ascorbic Acid (VITAMIN C) 1000 MG tablet Take 1,000 mg by mouth every evening.   Marland Kitchen aspirin EC 81 MG tablet Take 81 mg by mouth daily. Swallow whole.  . benazepril (LOTENSIN) 20 MG tablet Take 20 mg by mouth daily.  . cyanocobalamin 1000 MCG tablet Take 1,000 mcg by mouth in the morning and at bedtime.   . Empagliflozin-linaGLIPtin (GLYXAMBI) 25-5 MG TABS Take 1 tablet by mouth daily.  . furosemide (LASIX) 20 MG tablet Take 20 mg by mouth every Tuesday, Thursday, and Saturday at 6 PM.  . glipiZIDE (GLUCOTROL XL) 10  MG 24 hr tablet Take 10 mg by mouth in the morning and at bedtime.  . hydrochlorothiazide (HYDRODIURIL) 25 MG tablet Take 25 mg by mouth daily.  . metFORMIN (GLUCOPHAGE) 1000 MG tablet Take 1,000 mg by mouth 2 (two) times daily with a meal.  . potassium chloride SA (KLOR-CON) 20 MEQ tablet Take 20 mEq by mouth every Tuesday, Thursday, and Saturday at 6 PM.  . tadalafil (CIALIS) 10 MG tablet Take 10 mg by mouth daily as needed for erectile dysfunction.   . traZODone (DESYREL) 50 MG tablet Take 50 mg by mouth at bedtime.  . Vitamin D, Ergocalciferol, 50 MCG (2000 UT) CAPS Take 2,000 Units by mouth daily.   . vitamin E 45 MG (100 UNITS) capsule Take 100 Units by mouth every evening.   . rosuvastatin (CRESTOR) 20 MG tablet Take 1 tablet (20 mg total) by mouth daily.  . WHEAT GERM OIL PO Take 1 capsule by mouth in the morning and at bedtime.  (Patient not taking: Reported on 05/21/2020)   No  facility-administered encounter medications on file as of 05/21/2020.     REVIEW OF SYSTEMS  : All other systems reviewed and negative except where noted in the History of Present Illness.   PHYSICAL EXAM: BP 110/74   Pulse 94   Ht 5\' 10"  (1.778 m)   Wt 197 lb (89.4 kg)   BMI 28.27 kg/m  General: Well developed white male in no acute distress Head: Normocephalic and atraumatic Eyes:  Sclerae anicteric, conjunctiva pink. Ears: Normal auditory acuity Lungs: Clear throughout to auscultation; no W/R/R. Heart: Regular rate and rhythm; no M/R/G. Abdomen: Soft, non-distended.  BS present.  Non-tender. Rectal:  Will be done at the time of his colonoscopy. Musculoskeletal: Symmetrical with no gross deformities  Skin: No lesions on visible extremities Extremities: No edema  Neurological: Alert oriented x 4, grossly non-focal Psychological:  Alert and cooperative. Normal mood and affect  ASSESSMENT AND PLAN: *Change in bowel habits with complaints of greasy, oily stools, with change in frequency and foul odor: C. difficile negative, fecal elastase normal.  Patient is very distressed by symptoms.  Dr. [name] is offering EGD and colonoscopy.  For now he is only scheduled for colonoscopy next week.  To do both procedures together there was no availability until the end of April.  I will discuss this with Dr. [name].  I spent an extensive amount of time with the patient answering questions, etc.  At the end of the visit I feel that he was satisfied and all of his questions have been answered  CC:  Bernerd Limbo, MD

## 2020-05-21 NOTE — Patient Instructions (Signed)
If you are age 71 or older, your body mass index should be between 23-30. Your Body mass index is 28.27 kg/m. If this is out of the aforementioned range listed, please consider follow up with your Primary Care Provider.  If you are age 62 or younger, your body mass index should be between 19-25. Your Body mass index is 28.27 kg/m. If this is out of the aformentioned range listed, please consider follow up with your Primary Care Provider.   You have been scheduled for a colonoscopy. Please follow written instructions given to you at your visit today.  Please pick up your prep supplies at the pharmacy within the next 1-3 days. If you use inhalers (even only as needed), please bring them with you on the day of your procedure.  Due to recent changes in healthcare laws, you may see the results of your imaging and laboratory studies on MyChart before your provider has had a chance to review them.  We understand that in some cases there may be results that are confusing or concerning to you. Not all laboratory results come back in the same time frame and the provider may be waiting for multiple results in order to interpret others.  Please give [place] 48 hours in order for your provider to thoroughly review all the results before contacting the office for clarification of your results.

## 2020-05-21 NOTE — Progress Notes (Signed)
____________________________________________________________  Attending physician addendum:  Thank you for sending this case to me. I have reviewed the entire note and agree with the plan as discussed in yesterday's phone note and our conversation.   Wilfrid Lund, MD  ____________________________________________________________

## 2020-05-23 ENCOUNTER — Encounter: Payer: Self-pay | Admitting: Gastroenterology

## 2020-05-27 ENCOUNTER — Encounter: Payer: Self-pay | Admitting: Gastroenterology

## 2020-05-27 ENCOUNTER — Ambulatory Visit (AMBULATORY_SURGERY_CENTER): Payer: Medicare HMO | Admitting: Gastroenterology

## 2020-05-27 ENCOUNTER — Other Ambulatory Visit: Payer: Self-pay

## 2020-05-27 VITALS — BP 110/65 | HR 71 | Temp 98.1°F | Resp 13 | Ht 70.0 in | Wt 197.0 lb

## 2020-05-27 DIAGNOSIS — K5730 Diverticulosis of large intestine without perforation or abscess without bleeding: Secondary | ICD-10-CM

## 2020-05-27 DIAGNOSIS — K269 Duodenal ulcer, unspecified as acute or chronic, without hemorrhage or perforation: Secondary | ICD-10-CM | POA: Diagnosis not present

## 2020-05-27 DIAGNOSIS — D123 Benign neoplasm of transverse colon: Secondary | ICD-10-CM

## 2020-05-27 DIAGNOSIS — R194 Change in bowel habit: Secondary | ICD-10-CM | POA: Diagnosis not present

## 2020-05-27 DIAGNOSIS — K2970 Gastritis, unspecified, without bleeding: Secondary | ICD-10-CM

## 2020-05-27 DIAGNOSIS — D122 Benign neoplasm of ascending colon: Secondary | ICD-10-CM

## 2020-05-27 DIAGNOSIS — K648 Other hemorrhoids: Secondary | ICD-10-CM | POA: Diagnosis not present

## 2020-05-27 MED ORDER — SODIUM CHLORIDE 0.9 % IV SOLN: 500.0000 mL | Freq: Once | INTRAVENOUS | Status: DC

## 2020-05-27 MED ORDER — OMEPRAZOLE 40 MG PO CPDR: 40.0000 mg | DELAYED_RELEASE_CAPSULE | Freq: Every day | ORAL | 0 refills | Status: DC

## 2020-05-27 NOTE — Patient Instructions (Signed)
Handouts provided on gastritis, polyps, diverticulosis and hemorrhoids.   Discontinue aspirin, ibuprofen, naproxen, or other non-steriodal anti-inflammatory drugs. You may use Tylenol as needed for mild pain or fevers.  Return to your primary care physician to discuss how strongly aspirin is indicated.   Use Prilosec (omeprazole) 40mg  by mouth daily for 4 weeks. This is to treat the gastric/duodenal ulcers.    YOU HAD AN ENDOSCOPIC PROCEDURE TODAY AT [facility]:   Refer to the procedure report that was given to you for any specific questions about what was found during the examination.  If the procedure report does not answer your questions, please call your gastroenterologist to clarify.  If you requested that your care partner not be given the details of your procedure findings, then the procedure report has been included in a sealed envelope for you to review at your convenience later.  YOU SHOULD EXPECT: Some feelings of bloating in the abdomen. Passage of more gas than usual.  Walking can help get rid of the air that was put into your GI tract during the procedure and reduce the bloating. If you had a lower endoscopy (such as a colonoscopy or flexible sigmoidoscopy) you may notice spotting of blood in your stool or on the toilet paper. If you underwent a bowel prep for your procedure, you may not have a normal bowel movement for a few days.  Please Note:  You might notice some irritation and congestion in your nose or some drainage.  This is from the oxygen used during your procedure.  There is no need for concern and it should clear up in a day or so.  SYMPTOMS TO REPORT IMMEDIATELY:   Following lower endoscopy (colonoscopy or flexible sigmoidoscopy):  Excessive amounts of blood in the stool  Significant tenderness or worsening of abdominal pains  Swelling of the abdomen that is new, acute  Fever of 100F or higher   Following upper endoscopy (EGD)  Vomiting of blood  or coffee ground material  New chest pain or pain under the shoulder blades  Painful or persistently difficult swallowing  New shortness of breath  Fever of 100F or higher  Black, tarry-looking stools  For urgent or emergent issues, a gastroenterologist can be reached at any hour by calling [phone]. Do not use MyChart messaging for urgent concerns.    DIET:  We do recommend a small meal at first, but then you may proceed to your regular diet.  Drink plenty of fluids but you should avoid alcoholic beverages for 24 hours.  ACTIVITY:  You should plan to take it easy for the rest of today and you should NOT DRIVE or use heavy machinery until tomorrow (because of the sedation medicines used during the test).    FOLLOW UP: Our staff will call the number listed on your records 48-72 hours following your procedure to check on you and address any questions or concerns that you may have regarding the information given to you following your procedure. If we do not reach you, we will leave a message.  We will attempt to reach you two times.  During this call, we will ask if you have developed any symptoms of COVID 19. If you develop any symptoms (ie: fever, flu-like symptoms, shortness of breath, cough etc.) before then, please call [phone].  If you test positive for Covid 19 in the 2 weeks post procedure, please call and report this information to [place].    If any biopsies were taken  you will be contacted by phone or by letter within the next 1-3 weeks.  Please call us at [phone] if you have not heard about the biopsies in 3 weeks.    SIGNATURES/CONFIDENTIALITY: You and/or your care partner have signed paperwork which will be entered into your electronic medical record.  These signatures attest to the fact that that the information above on your After Visit Summary has been reviewed and is understood.  Full responsibility of the confidentiality of this discharge information lies with you  and/or your care-partner.

## 2020-05-27 NOTE — Op Note (Signed)
[facility] Patient Name: Ronald Cruz Procedure Date: 05/27/2020 2:48 PM MRN: [id number] Endoscopist: Mallie Mussel L. [name] , MD Age: 70 Referring MD:  Date of Birth: 01-08-1951 Gender: Male Account #: [id number] Procedure:                Colonoscopy Indications:              Change in bowel habits (see EGD report and recent                            office notes for clinical details) Medicines:                Monitored Anesthesia Care Procedure:                Pre-Anesthesia Assessment:                           - Prior to the procedure, a History and Physical                            was performed, and patient medications and                            allergies were reviewed. The patient's tolerance of                            previous anesthesia was also reviewed. The risks                            and benefits of the procedure and the sedation                            options and risks were discussed with the patient.                            All questions were answered, and informed consent                            was obtained. Prior Anticoagulants: The patient has                            taken no previous anticoagulant or antiplatelet                            agents. ASA Grade Assessment: III - A patient with                            severe systemic disease. After reviewing the risks                            and benefits, the patient was deemed in                            satisfactory condition to undergo the procedure.  After obtaining informed consent, the colonoscope                            was passed under direct vision. Throughout the                            procedure, the patient's blood pressure, pulse, and                            oxygen saturations were monitored continuously. The                            Olympus CF-HQ190L ([id number]) Colonoscope was                            introduced through the anus  and advanced to the the                            terminal ileum, with identification of the                            appendiceal orifice and IC valve. The colonoscopy                            was performed without difficulty. The patient                            tolerated the procedure well. The quality of the                            bowel preparation was good. The terminal ileum,                            ileocecal valve, appendiceal orifice, and rectum                            were photographed. The bowel preparation used was                            Plenvu. Scope In: 3:14:02 PM Scope Out: 3:29:27 PM Scope Withdrawal Time: 0 hours 12 minutes 24 seconds  Total Procedure Duration: 0 hours 15 minutes 25 seconds  Findings:                 The perianal and digital rectal examinations were                            normal.                           The terminal ileum appeared normal.                           Normal mucosa was found in the entire colon.  Biopsies for histology were taken with a cold                            forceps from the right colon and left colon for                            evaluation of microscopic colitis.                           Three sessile polyps were found in the transverse                            colon and ascending colon. The polyps were 2 to 4                            mm in size. These polyps were removed with a cold                            snare. Resection and retrieval were complete.                           A few diverticula were found in the transverse                            colon.                           Internal hemorrhoids were found. The hemorrhoids                            were small.                           The exam was otherwise without abnormality on                            direct and retroflexion views. Complications:            No immediate complications. Estimated Blood  Loss:     Estimated blood loss was minimal. Impression:               - The examined portion of the ileum was normal.                           - Normal mucosa in the entire examined colon.                            Biopsied.                           - Three 2 to 4 mm polyps in the transverse colon                            and in the ascending colon, removed with a cold  snare. Resected and retrieved.                           - Diverticulosis in the transverse colon.                           - Internal hemorrhoids.                           - The examination was otherwise normal on direct                            and retroflexion views. Recommendation:           - Patient has a contact number available for                            emergencies. The signs and symptoms of potential                            delayed complications were discussed with the                            patient. Return to normal activities tomorrow.                            Written discharge instructions were provided to the                            patient.                           - Resume previous diet.                           - Continue present medications.                           - Await pathology results.                           - Repeat colonoscopy is recommended for                            surveillance. The colonoscopy date will be                            determined after pathology results from today's                            exam become available for review. Hanan Mcwilliams L. [name], MD 05/27/2020 3:45:30 PM This report has been signed electronically.

## 2020-05-27 NOTE — Progress Notes (Signed)
Called to room to assist during endoscopic procedure.  Patient ID and intended procedure confirmed with present staff. Received instructions for my participation in the procedure from the performing physician.  

## 2020-05-27 NOTE — Op Note (Signed)
Ronald Cruz: Ronald Cruz Procedure Date: 05/27/2020 2:49 PM MRN: [id number] Endoscopist: Mallie Mussel L. Ronald Cruz , MD Age: 70 Referring MD:  Date of Birth: July 06, 1950 Gender: Male Account #: [id number] Procedure:                Upper GI endoscopy Indications:              Diarrhea/change bowel habits (stool has become less                            formed and "greasy") - negative for C diff, normal                            fecal elastase. no abdominal pain or weight loss Medicines:                Monitored Anesthesia Care Procedure:                Pre-Anesthesia Assessment:                           - Prior to the procedure, a History and Physical                            was performed, and patient medications and                            allergies were reviewed. The patient's tolerance of                            previous anesthesia was also reviewed. The risks                            and benefits of the procedure and the sedation                            options and risks were discussed with the patient.                            All questions were answered, and informed consent                            was obtained. Prior Anticoagulants: The patient has                            taken no previous anticoagulant or antiplatelet                            agents. ASA Grade Assessment: III - A patient with                            severe systemic disease. After reviewing the risks                            and benefits, the patient was deemed in  satisfactory condition to undergo the procedure.                           After obtaining informed consent, the endoscope was                            passed under direct vision. Throughout the                            procedure, the patient's blood pressure, pulse, and                            oxygen saturations were monitored continuously. The                             Endoscope was introduced through the mouth, and                            advanced to the second part of duodenum. The upper                            GI endoscopy was accomplished without difficulty.                            The patient tolerated the procedure well. Scope In: Scope Out: Findings:                 The esophagus was normal.                           A few diminutive erosions with stigmata of recent                            bleeding were found in the gastric fundus and in                            the prepyloric region of the stomach. Biopsies were                            taken with a cold forceps for histology (antrum and                            body to rule out H pylori).                           The exam of the stomach was otherwise normal.                           Many non-bleeding superficial duodenal ulcers with                            pigmented material were found in the entire  duodenum. The largest lesion was 8 mm in largest                            dimension. Biopsies were taken from the second                            portion of the duodenum with a cold forceps for                            histology. (to rule out malabsorptive conditions)                           The exam of the duodenum was otherwise normal. Complications:            No immediate complications. Estimated Blood Loss:     Estimated blood loss was minimal. Impression:               - Normal esophagus.                           - Erosive gastropathy with stigmata of recent                            bleeding. Biopsied.                           - Non-bleeding duodenal ulcers with pigmented                            material. Biopsied. Recommendation:           - Patient has a contact number available for                            emergencies. The signs and symptoms of potential                            delayed complications were discussed with  the                            patient. Return to normal activities tomorrow.                            Written discharge instructions were provided to the                            patient.                           - Resume previous diet.                           - Discontinue aspirin and NSAIDs.                           - Use Prilosec (omeprazole) 40 mg PO daily for 4  weeks.                           - Await pathology results.                           - See the other procedure note for documentation of                            additional recommendations.                           - Return to primary care physician to discuss how                            strongly aspirin is indicated. Henry L. Ronald Carrow, MD 05/27/2020 3:40:48 PM This report has been signed electronically.

## 2020-05-27 NOTE — Progress Notes (Signed)
pt tolerated well. VSS. awake and to recovery. Report given to RN. Bite block left insitu to recovery; placed while pt awake and denied discomfort. No signs of trauma.

## 2020-05-27 NOTE — Progress Notes (Signed)
VS by CW  Pt's states no medical or surgical changes since previsit or office visit.  

## 2020-05-29 ENCOUNTER — Telehealth: Payer: Self-pay | Admitting: *Deleted

## 2020-05-29 NOTE — Telephone Encounter (Signed)
Left message on f/u call 

## 2020-05-29 NOTE — Telephone Encounter (Signed)
  Follow up Call-  Call back number 05/27/2020  Post procedure Call Back phone  # [phone]  Permission to leave phone message Yes  Some recent data might be hidden     Patient questions:  Do you have a fever, pain , or abdominal swelling? No. Pain Score  0 *  Have you tolerated food without any problems? Yes.    Have you been able to return to your normal activities? Yes.    Do you have any questions about your discharge instructions: Diet   No. Medications  No. Follow up visit  No.  Do you have questions or concerns about your Care? No.  Actions: * If pain score is 4 or above: No action needed, pain <4. 1. Have you developed a fever since your procedure? no  2.   Have you had an respiratory symptoms (SOB or cough) since your procedure? no  3.   Have you tested positive for COVID 19 since your procedure no  4.   Have you had any family members/close contacts diagnosed with the COVID 19 since your procedure?  no   If yes to any of these questions please route to Joylene John, RN and Joella Prince, RN

## 2020-06-08 ENCOUNTER — Encounter: Payer: Self-pay | Admitting: Gastroenterology

## 2020-06-11 ENCOUNTER — Telehealth: Payer: Self-pay | Admitting: Cardiology

## 2020-06-11 NOTE — Telephone Encounter (Signed)
Pt c/o BP issue: STAT if pt c/o blurred vision, one-sided weakness or slurred speech  1. What are your last 5 BP readings?  110/60 113/67 112/63 100/61 78/50 HR 84 124/89 HR 77 96/54 HR 73  2. Are you having any other symptoms (ex. Dizziness, headache, blurred vision, passed out)? Headache, dizziness  3. What is your BP issue? Patient states he got his 2nd booster shot on Sunday 06/08/20 and started feeling bad about 1 am Monday more

## 2020-06-12 NOTE — Telephone Encounter (Signed)
Spoke with patient about stopping Hydrochlorothiazide for 2 days then starting to retake it by taking half of a pill. Patient verbalized understanding. "I just want to be around for a long time. I am very health conscious."

## 2020-06-12 NOTE — Telephone Encounter (Signed)
Left message for patient to return our call in regards to his symptoms and changing his medication.

## 2020-06-12 NOTE — Telephone Encounter (Signed)
Patient was returning phone call and was very frustrated he was able to talk to the nurse. He states he has concerns bout his health and he needs someone to call him back asap.

## 2020-07-18 ENCOUNTER — Telehealth: Payer: Self-pay

## 2020-07-18 DIAGNOSIS — R194 Change in bowel habit: Secondary | ICD-10-CM

## 2020-07-18 NOTE — Telephone Encounter (Signed)
Pt sent the following message via mychart on 5/6, let him know Dr. [name] would return to the office on Monday and review message upon return.  Juarbe, Marcellina Millin, Estill Cotta III, MD 2 minutes ago (11:57 AM)   CS   There are no new test results in my chart? My bowel situation is still not normal, but slightly better. I think we need to pursue this further. My stool is still foul-smelling and very greasy/oily.  Please advise, thank you, Ronald Cruz

## 2020-07-21 ENCOUNTER — Other Ambulatory Visit: Payer: Medicare HMO

## 2020-07-21 NOTE — Telephone Encounter (Signed)
Spoke with patient in regards to recommendations. Patient had several questions as to why this test was not ordered previously and he wanted to clarify that he has not started traveling internationally yet but doe not want to risk traveling with his symptoms. Patient wanted to know what the next steps would be I the stool test came back negative. Advised that once Dr. [name] is able to review the information then he will advise at that time. Patient is aware that no appt is necessary and he can stop by to pick up the stool kit and instructions. Patient verbalized understanding and had no concerns at the end of the call.

## 2020-07-21 NOTE — Telephone Encounter (Signed)
Sorry to hear he is still having this trouble.  When I first saw him, we tested for C diff and checked to make sure there was sufficient pancreas enzyme production (eleastase), and those were normal.  Primary care had done a fecal fat test that was normal.  Since he describes the stool as greasy, and because he told me he travels internationally for work, perhaps he picked up a parasitic infection.  Please arrange a GI pathogen panel.  - HD

## 2020-07-21 NOTE — Addendum Note (Signed)
Addended by: Yevette Edwards on: 07/21/2020 01:44 PM   Modules accepted: Orders

## 2020-07-23 ENCOUNTER — Other Ambulatory Visit: Payer: Medicare HMO

## 2020-07-23 DIAGNOSIS — R194 Change in bowel habit: Secondary | ICD-10-CM

## 2020-07-26 DIAGNOSIS — R194 Change in bowel habit: Secondary | ICD-10-CM

## 2020-07-26 LAB — SPECIMEN STATUS REPORT

## 2020-07-28 NOTE — Telephone Encounter (Signed)
Spoke with Ronald Cruz at [facility] in regards to stool test being cancelled. We are not able to add on the test because they received the specimen at the wrong temp. They received a frozen specimen and this specimen should be room temp in a parapak orange container.

## 2020-07-29 LAB — GI PROFILE, STOOL, PCR

## 2020-07-30 ENCOUNTER — Telehealth: Payer: Self-pay

## 2020-07-30 NOTE — Telephone Encounter (Deleted)
[facility] Pre-operative Risk Assessment     Request for surgical clearance:     Endoscopy Procedure  What type of surgery is being performed?     EGD/Colon/LEC Dr [name]   When is this surgery scheduled?     09-04-2020   What type of clearance is required ?   Pharmacy  Are there any medications that need to be held prior to surgery and how long? Yes, Plavix 5 days before  Practice name and name of physician performing surgery?      [facility]  What is your office phone and fax number?      Phone- [phone]  [phone]  Anesthesia type (None, local, MAC, general) ?       MAC

## 2020-07-30 NOTE — Telephone Encounter (Signed)
Created in error. Please disregard.

## 2020-07-31 NOTE — Addendum Note (Signed)
Addended by: Yevette Edwards on: 07/31/2020 09:09 AM   Modules accepted: Orders

## 2020-08-01 ENCOUNTER — Other Ambulatory Visit: Payer: Medicare HMO

## 2020-08-04 ENCOUNTER — Other Ambulatory Visit: Payer: Medicare HMO

## 2020-08-04 DIAGNOSIS — E119 Type 2 diabetes mellitus without complications: Secondary | ICD-10-CM | POA: Insufficient documentation

## 2020-08-04 DIAGNOSIS — G4700 Insomnia, unspecified: Secondary | ICD-10-CM | POA: Insufficient documentation

## 2020-08-04 DIAGNOSIS — R194 Change in bowel habit: Secondary | ICD-10-CM

## 2020-08-05 ENCOUNTER — Encounter: Payer: Self-pay | Admitting: Cardiology

## 2020-08-05 NOTE — Telephone Encounter (Signed)
error 

## 2020-08-06 ENCOUNTER — Ambulatory Visit: Payer: Medicare HMO | Admitting: Cardiology

## 2020-08-07 LAB — GI PROFILE, STOOL, PCR

## 2020-08-07 MED ORDER — AZITHROMYCIN 500 MG PO TABS: 500.0000 mg | ORAL_TABLET | Freq: Every day | ORAL | 0 refills | Status: AC

## 2020-08-18 ENCOUNTER — Ambulatory Visit (INDEPENDENT_AMBULATORY_CARE_PROVIDER_SITE_OTHER): Payer: Medicare HMO | Admitting: Cardiology

## 2020-08-18 ENCOUNTER — Other Ambulatory Visit: Payer: Self-pay

## 2020-08-18 ENCOUNTER — Encounter: Payer: Self-pay | Admitting: Cardiology

## 2020-08-18 VITALS — BP 120/60 | HR 67 | Ht 70.0 in | Wt 197.0 lb

## 2020-08-18 DIAGNOSIS — I2510 Atherosclerotic heart disease of native coronary artery without angina pectoris: Secondary | ICD-10-CM

## 2020-08-18 DIAGNOSIS — E782 Mixed hyperlipidemia: Secondary | ICD-10-CM

## 2020-08-18 DIAGNOSIS — I10 Essential (primary) hypertension: Secondary | ICD-10-CM | POA: Diagnosis not present

## 2020-08-18 DIAGNOSIS — E119 Type 2 diabetes mellitus without complications: Secondary | ICD-10-CM

## 2020-08-18 MED ORDER — POTASSIUM CHLORIDE ER 10 MEQ PO TBCR: 10.0000 meq | EXTENDED_RELEASE_TABLET | ORAL | 2 refills | Status: DC

## 2020-08-18 MED ORDER — FUROSEMIDE 20 MG PO TABS: 20.0000 mg | ORAL_TABLET | ORAL | 2 refills | Status: DC

## 2020-08-18 NOTE — Addendum Note (Signed)
Addended by: Orvan July on: 08/18/2020 09:37 AM   Modules accepted: Orders

## 2020-08-18 NOTE — Progress Notes (Signed)
Cardiology Office Note:    Date:  08/18/2020   ID:  Ronald Cruz, DOB 09/22/1950, MRN [id number]  PCP:  Bernerd Limbo, MD  Cardiologist:  Berniece Salines, DO  Electrophysiologist:  None   Referring MD: Bernerd Limbo, MD     History of Present Illness:    Ronald Cruz is a 70 y.o. male with a hx of coronary artery disease, hypertension, hyperlipidemia, diabetes is here today for follow-up visit.  I last saw the patient on February 06, 2020 at that time he appeared to be doing well from a cardiovascular standpoint.  He had chest completed heart catheterization where he had mild to moderate nonobstructive coronary artery disease.  Today he offers no complaints.  He has been doing well.  He had a period where he transiently had hypotension but all had resolved.   Past Medical History:  Diagnosis Date  . Acute pain of left shoulder 03/30/2017  . Allergic rhinitis 08/19/2012  . ASCVD (arteriosclerotic cardiovascular disease) 10/24/2019  . Cervical spondylosis 07/20/2010  . Chronic midline low back pain without sciatica 08/28/2019   Last Assessment & Plan:  Formatting of this note might be different from the original. This pain is chronic, but he does not take medication, nor have any radicular symptoms.  Because he can have multiple diagnostic imaging studies performed on the same day for a single co-pay, he desires MRI of the lumbar spine to be scheduled along with previously planned MRI of the brain.  MRI and plain films o  . Contact dermatitis and eczema due to plant 06/28/2012   Formatting of this note might be different from the original. Overview:  IMPRESSION: Topical itch creams advised also.  Steroid will affect blood sugar of next few days. Formatting of this note might be different from the original. IMPRESSION: Topical itch creams advised also.  Steroid will affect blood sugar of next few days. Formatting of this note might be different from the original. Overview:  . Depressive disorder  08/19/2012  . Diabetes ([place])   . 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 Overview:  IMPRESSION: Pt's Levitra has not worked. We will change to Viagra. Welbutrin usually does not affect erection. It could be more a result of his DM Formatting of this note might be different from the original. IMPRESSION: Pt's Levitra has not worked. We will change to Viagra. Welbutrin usually does not affect erection. It coul  . Essential hypertension 08/19/2012  . Herpes zoster 05/02/2013   Formatting of this note might be different from the original. Overview:  STORY: rash since sunday, post 72 hours window for antiviral therapy. `E1o3L`pain described is consistant with shingles, the rash is not vesicular, but has been scratching areas.`E1o3L`IMPRESSION: pain bearable. will watch and start neurontin if needed Formatting of this note might be different from the original. STORY: rash   . Hyperlipidemia 10/24/2019  . Insomnia   . Lump in neck 08/28/2019   Formatting of this note might be different from the original. RIGHT, just posterior to the angle of the mandible.  Nontender.  Soft and mobile.  Last Assessment & Plan:  Formatting of this note might be different from the original. Right, just posterior to the angle of the mandible.  Nontender.  Soft and mobile.  This is not concerning for neoplasm.  . Memory changes 08/21/2019  . Mixed hyperlipidemia 05/02/2013   Formatting of this note might be different from the original. Overview:  IMPRESSION: The goal is to have your total cholesterol < 200, the HDL (good cholesterol) >40, and the LDL (bad cholesterol) <100.`E1o3L`It is recomended that you follow a good low fat diet and exercise for 30 minutes 3-4 times a week. Formatting of this note might be different from the original. IMPRESSION: The goal is to hav  . Obesity 05/02/2013  . Obesity (BMI 30-39.9) 10/24/2019  . Onychomycosis 12/31/2013  .  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 Pain of left hand 03/30/2017  . Pain of right shoulder joint on movement 03/30/2017  . 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 Screening for colon cancer 10/17/2015  . Screening for eye condition 05/02/2013  . Tendinitis of left shoulder 09/25/2014  . Tinea corporis 11/04/2016  . Trigger finger 11/23/2017  . Type 2 diabetes mellitus without complication, without long-term current use of insulin ([place]) 10/24/2019  . Type II diabetes mellitus ([place]) 05/02/2013   Formatting of this note might be different from the original. Overview:  IMPRESSION: The goal is for the Hgb A1C to be less than 7.0.`E1o3L`It is recommended that all diatetics are educated on and follow a healthy diabetic diet, exercise for 30 minutes 3-4 times per week (walking, biking, swimming, or machine), monitor blood glucose readings and bring that record with you to be reviewed at your ne  . Vitamin D deficiency 08/18/2011   Formatting of this note might be different from the original. Overview:  IMPRESSION: patient taking Vitamin D 2000IU per day. His levels are normal. Continue same dose Formatting of this note might be different from the original. IMPRESSION: patient taking Vitamin D 2000IU per day. His levels are normal. Continue same dose Formatting of this note might be different from the original. Overview:  Ov    Past Surgical History:  Procedure Laterality Date  . HAND SURGERY Right    Thumb  . HERNIA REPAIR    . LEFT HEART CATH AND CORONARY ANGIOGRAPHY N/A 01/04/2020   Procedure: LEFT HEART CATH AND CORONARY ANGIOGRAPHY;  Surgeon: Burnell Blanks, MD;  Location: [facility];  Service: Cardiovascular;  Laterality: N/A;    Current Medications: Current Meds  Medication Sig  . Ascorbic Acid (VITAMIN C) 1000 MG tablet Take 1,000 mg by mouth every evening.   Marland Kitchen aspirin EC 81 MG tablet Take 81 mg by mouth daily. Swallow whole.  . benazepril (LOTENSIN) 20 MG tablet  Take 20 mg by mouth daily.  . cyanocobalamin 1000 MCG tablet Take 1,000 mcg by mouth in the morning and at bedtime.   . Empagliflozin-linaGLIPtin (GLYXAMBI) 25-5 MG TABS Take 1 tablet by mouth daily.  . furosemide (LASIX) 20 MG tablet Take 20 mg by mouth every Tuesday, Thursday, and Saturday at 6 PM.  . glipiZIDE (GLUCOTROL XL) 10 MG 24 hr tablet Take 10 mg by mouth in the morning and at bedtime.  . hydrochlorothiazide (HYDRODIURIL) 25 MG tablet Take 25 mg by mouth daily.  . metFORMIN (GLUCOPHAGE) 1000 MG tablet Take 1,000 mg by mouth 2 (two) times daily with a meal.  . potassium chloride SA (KLOR-CON) 20 MEQ tablet Take 20 mEq by mouth every Tuesday, Thursday, and Saturday at 6 PM.  . traZODone (DESYREL) 50 MG tablet Take 50 mg by mouth at bedtime.  . Vitamin D, Ergocalciferol, 50 MCG (2000 UT) CAPS Take 2,000 Units by mouth daily.   . vitamin E 45 MG (100 UNITS) capsule Take 100 Units by mouth every evening.  . WHEAT GERM OIL PO  Take 1 capsule by mouth in the morning and at bedtime.  . [DISCONTINUED] omeprazole (PRILOSEC) 40 MG capsule Take 1 capsule (40 mg total) by mouth daily.  . [DISCONTINUED] tadalafil (CIALIS) 10 MG tablet Take 10 mg by mouth daily as needed for erectile dysfunction.      Allergies:   Patient has no known allergies.   Social History   Socioeconomic History  . Marital status: Married    Spouse name: Not on file  . Number of children: Not on file  . Years of education: Not on file  . Highest education level: Not on file  Occupational History  . Not on file  Tobacco Use  . Smoking status: Never Smoker  . Smokeless tobacco: Never Used  Substance and Sexual Activity  . Alcohol use: Yes    Comment: beer once or twice a month  . Drug use: Never  . Sexual activity: Not on file  Other Topics Concern  . Not on file  Social History Narrative  . Not on file   Social Determinants of Health   Financial Resource Strain: Not on file  Food Insecurity: Not on file   Transportation Needs: Not on file  Physical Activity: Not on file  Stress: Not on file  Social Connections: Not on file     Family History: The patient's family history includes Cancer in his paternal grandfather; Diabetes in his father, maternal uncle, maternal uncle, paternal aunt, and paternal uncle; Diverticulosis in his father, sister, and sister; Esophageal cancer in his mother; Irritable bowel syndrome in his father and sister; Pancreatic cancer in his maternal grandfather; Stroke in his father. There is no history of Colon cancer, Stomach cancer, or Rectal cancer.  ROS:   Review of Systems  Constitution: Negative for decreased appetite, fever and weight gain.  HENT: Negative for congestion, ear discharge, hoarse voice and sore throat.   Eyes: Negative for discharge, redness, vision loss in right eye and visual halos.  Cardiovascular: Negative for chest pain, dyspnea on exertion, leg swelling, orthopnea and palpitations.  Respiratory: Negative for cough, hemoptysis, shortness of breath and snoring.   Endocrine: Negative for heat intolerance and polyphagia.  Hematologic/Lymphatic: Negative for bleeding problem. Does not bruise/bleed easily.  Skin: Negative for flushing, nail changes, rash and suspicious lesions.  Musculoskeletal: Negative for arthritis, joint pain, muscle cramps, myalgias, neck pain and stiffness.  Gastrointestinal: Negative for abdominal pain, bowel incontinence, diarrhea and excessive appetite.  Genitourinary: Negative for decreased libido, genital sores and incomplete emptying.  Neurological: Negative for brief paralysis, focal weakness, headaches and loss of balance.  Psychiatric/Behavioral: Negative for altered mental status, depression and suicidal ideas.  Allergic/Immunologic: Negative for HIV exposure and persistent infections.    EKGs/Labs/Other Studies Reviewed:    The following studies were reviewed today:   EKG: None today  January 04, 2020  Left catheterizationProx RCA lesion is 30% stenosed.  Ost LM to Mid LM lesion is 20% stenosed.  Ost Cx to Prox Cx lesion is 20% stenosed.  Dist LM to Prox LAD lesion is 30% stenosed.  LPDA lesion is 70% stenosed.  1. Mild distal left main stenosis. This lesion is calcified 2. Mild ostial LAD stenosis. This lesion is calcified 3. Mild ostial Circumflex stenosis. This lesion is calcified. The most distal small obtuse marginal branch has a moderate, eccentric stenosis.  4. The RCA is a small to moderate caliber non-dominant artery with mild plaque in the proximal and mid segments.   Recommendations: Medical management of mild  non-obstructive CAD.   Transthoracic echocardiogram 11/27/2019 IMPRESSIONS  1. Left ventricular ejection fraction, by estimation, is 55 to 60%. The  left ventricle has normal function. The left ventricle has no regional  wall motion abnormalities. Left ventricular diastolic parameters are  consistent with Grade I diastolic  dysfunction (impaired relaxation).  2. Right ventricular systolic function is normal. The right ventricular  size is normal. Tricuspid regurgitation signal is inadequate for assessing  PA pressure.  3. The mitral valve is grossly normal. Trivial mitral valve  regurgitation. No evidence of mitral stenosis.  4. The aortic valve is tricuspid. Aortic valve regurgitation is not  visualized. No aortic stenosis is present.  5. The inferior vena cava is normal in size with greater than 50%  respiratory variability, suggesting right atrial pressure of 3 mmHg.   Conclusion(s)/Recommendation(s): Normal biventricular function without  evidence of hemodynamically significant valvular heart disease.   FINDINGS  Left Ventricle: Left ventricular ejection fraction, by estimation, is 55  to 60%. The left ventricle has normal function. The left ventricle has no  regional wall motion abnormalities. Definity contrast agent was given IV  to  delineate the left ventricular  endocardial borders. The left ventricular internal cavity size was normal  in size. There is no left ventricular hypertrophy. Left ventricular  diastolic parameters are consistent with Grade I diastolic dysfunction  (impaired relaxation).   Right Ventricle: The right ventricular size is normal. No increase in  right ventricular wall thickness. Right ventricular systolic function is  normal. Tricuspid regurgitation signal is inadequate for assessing PA  pressure.   Left Atrium: Left atrial size was normal in size.   Right Atrium: Right atrial size was normal in size.   Pericardium: Trivial pericardial effusion is present.   Mitral Valve: The mitral valve is grossly normal. Trivial mitral valve  regurgitation. No evidence of mitral valve stenosis.   Tricuspid Valve: The tricuspid valve is grossly normal. Tricuspid valve  regurgitation is not demonstrated. No evidence of tricuspid stenosis.   Aortic Valve: The aortic valve is tricuspid. Aortic valve regurgitation is  not visualized. No aortic stenosis is present.   Pulmonic Valve: The pulmonic valve was grossly normal. Pulmonic valve  regurgitation is trivial. No evidence of pulmonic stenosis.   Aorta: The aortic root and ascending aorta are structurally normal, with  no evidence of dilitation.   Venous: The inferior vena cava is normal in size with greater than 50%  respiratory variability, suggesting right atrial pressure of 3 mmHg.   IAS/Shunts: The atrial septum is grossly normal.   Recent Labs: 12/20/2019: Magnesium 1.7 12/26/2019: BUN 15; Creatinine, Ser 1.15; Potassium 3.6; Sodium 140 12/28/2019: Hemoglobin 14.3; Platelets 272  Recent Lipid Panel No results found for: CHOL, TRIG, HDL, CHOLHDL, VLDL, LDLCALC, LDLDIRECT  Physical Exam:    VS:  BP 120/60   Pulse 67   Ht 5\' 10"  (1.778 m)   Wt 197 lb (89.4 kg)   SpO2 97%   BMI 28.27 kg/m     Wt Readings from Last 3 Encounters:   08/18/20 197 lb (89.4 kg)  05/27/20 197 lb (89.4 kg)  05/21/20 197 lb (89.4 kg)     GEN: Well nourished, well developed in no acute distress HEENT: Normal NECK: No JVD; No carotid bruits LYMPHATICS: No lymphadenopathy CARDIAC: S1S2 noted,RRR, no murmurs, rubs, gallops RESPIRATORY:  Clear to auscultation without rales, wheezing or rhonchi  ABDOMEN: Soft, non-tender, non-distended, +bowel sounds, no guarding. EXTREMITIES: No edema, No cyanosis, no clubbing MUSCULOSKELETAL:  No deformity  SKIN: Warm and dry NEUROLOGIC:  Alert and oriented x 3, non-focal PSYCHIATRIC:  Normal affect, good insight  ASSESSMENT:    1. Essential hypertension   2. Coronary artery disease involving native coronary artery of native heart without angina pectoris   3. Type 2 diabetes mellitus without complication, without long-term current use of insulin ([place])   4. Mixed hyperlipidemia    PLAN:     He is doing well from a cardiovascular standpoint.  He is euvolemic and given that he had transient hypotension going to decrease his Lasix to once weekly.  To avoid this recurrence in the future.  Had questions about repeating calcium scoring.  Discussed with the patient that right now the fact that he is on optimal medical therapy there is no benefit in repeating calcium score as this would not change his plan of care.  The best way forward will be if he develops chest pain assessed with functional testing like nuclear stress test to look for ischemia.  Hyperlipidemia - continue with current statin medication.  The patient understands the need to lose weight with diet and exercise. We have discussed specific strategies for this.  The patient is in agreement with the above plan. The patient left the office in stable condition.  The patient will follow up in   Medication Adjustments/Labs and Tests Ordered: Current medicines are reviewed at length with the patient today.  Concerns regarding medicines are  outlined above.  No orders of the defined types were placed in this encounter.  No orders of the defined types were placed in this encounter.   There are no Patient Instructions on file for this visit.   Adopting a Healthy Lifestyle.  Know what a healthy weight is for you (roughly BMI <25) and aim to maintain this   Aim for 7+ servings of fruits and vegetables daily   65-80+ fluid ounces of water or unsweet tea for healthy kidneys   Limit to max 1 drink of alcohol per day; avoid smoking/tobacco   Limit animal fats in diet for cholesterol and heart health - choose grass fed whenever available   Avoid highly processed foods, and foods high in saturated/trans fats   Aim for low stress - take time to unwind and care for your mental health   Aim for 150 min of moderate intensity exercise weekly for heart health, and weights twice weekly for bone health   Aim for 7-9 hours of sleep daily   When it comes to diets, agreement about the perfect plan isnt easy to find, even among the experts. Experts at the [employer] developed an idea known as the Healthy Eating Plate. Just imagine a plate divided into logical, healthy portions.   The emphasis is on diet quality:   Load up on vegetables and fruits - one-half of your plate: Aim for color and variety, and remember that potatoes dont count.   Go for whole grains - one-quarter of your plate: Whole wheat, barley, wheat berries, quinoa, oats, brown rice, and foods made with them. If you want pasta, go with whole wheat pasta.   Protein power - one-quarter of your plate: Fish, chicken, beans, and nuts are all healthy, versatile protein sources. Limit red meat.   The diet, however, does go beyond the plate, offering a few other suggestions.   Use healthy plant oils, such as olive, canola, soy, corn, sunflower and peanut. Check the labels, and avoid partially hydrogenated oil, which have unhealthy trans fats.  If youre  thirsty, drink water. Coffee and tea are good in moderation, but skip sugary drinks and limit milk and dairy products to one or two daily servings.   The type of carbohydrate in the diet is more important than the amount. Some sources of carbohydrates, such as vegetables, fruits, whole grains, and beans-are healthier than others.   Finally, stay active  Signed, Berniece Salines, DO  08/18/2020 9:24 AM    [place]

## 2020-08-18 NOTE — Patient Instructions (Addendum)
Medication Instructions:  Your physician has recommended you make the following change in your medication: DECREASE: Lasix 20 mg once weekly DECREASE: Potassium 10 meq once weekly *If you need a refill on your cardiac medications before your next appointment, please call your pharmacy*   Lab Work: None If you have labs (blood work) drawn today and your tests are completely normal, you will receive your results only by: Marland Kitchen MyChart Message (if you have MyChart) OR . A paper copy in the mail If you have any lab test that is abnormal or we need to change your treatment, we will call you to review the results.   Testing/Procedures: None   Follow-Up: At [facility], you and your health needs are our priority.  As part of our continuing mission to provide you with exceptional heart care, we have created designated Provider Care Teams.  These Care Teams include your primary Cardiologist (physician) and Advanced Practice Providers (APPs -  Physician Assistants and Nurse Practitioners) who all work together to provide you with the care you need, when you need it.  We recommend signing up for the patient portal called "MyChart".  Sign up information is provided on this After Visit Summary.  MyChart is used to connect with patients for Virtual Visits (Telemedicine).  Patients are able to view lab/test results, encounter notes, upcoming appointments, etc.  Non-urgent messages can be sent to your provider as well.   To learn more about what you can do with MyChart, go to [email or web address].    Your next appointment:   1 year(s)  The format for your next appointment:   In Person  Provider:   Berniece Salines, DO   Other Instructions

## 2020-08-27 ENCOUNTER — Telehealth: Payer: Self-pay | Admitting: Gastroenterology

## 2020-08-27 DIAGNOSIS — R194 Change in bowel habit: Secondary | ICD-10-CM

## 2020-08-27 DIAGNOSIS — K909 Intestinal malabsorption, unspecified: Secondary | ICD-10-CM

## 2020-08-27 NOTE — Telephone Encounter (Signed)
Please see my portal message from today to this patient  Please schedule a CT scan abdomen and pelvis with oral and IV contrast for steatorrhea, rule out pancreatic neoplasm or chronic pancreatitis.  Also, CBC and CMP  - HD

## 2020-08-28 NOTE — Telephone Encounter (Signed)
Will await further recommendations per Dr. [name].

## 2020-08-28 NOTE — Telephone Encounter (Signed)
Lab orders in epic. CT order in epic. Patient notified via my chart.

## 2020-08-28 NOTE — Telephone Encounter (Signed)
Please see my portal message from today with this patient.  Patient has requested an MRI of the abdomen instead. It would be with contrast, please change the order.  Apologies for the trouble.  - HD

## 2020-08-28 NOTE — Addendum Note (Signed)
Addended by: Yevette Edwards on: 08/28/2020 09:15 AM   Modules accepted: Orders

## 2020-08-28 NOTE — Telephone Encounter (Addendum)
CT order cancelled. MRI abdomen order in epic. Patient updated via my chart.  Staff message sent to radiology schedulers, April Pait and Rhys Martini.

## 2020-08-28 NOTE — Addendum Note (Signed)
Addended by: Yevette Edwards on: 08/28/2020 01:22 PM   Modules accepted: Orders

## 2020-09-10 ENCOUNTER — Other Ambulatory Visit (INDEPENDENT_AMBULATORY_CARE_PROVIDER_SITE_OTHER): Payer: Medicare HMO

## 2020-09-10 DIAGNOSIS — K909 Intestinal malabsorption, unspecified: Secondary | ICD-10-CM | POA: Diagnosis not present

## 2020-09-10 DIAGNOSIS — R194 Change in bowel habit: Secondary | ICD-10-CM | POA: Diagnosis not present

## 2020-09-10 LAB — COMPREHENSIVE METABOLIC PANEL
ALT: 17 U/L (ref 0–53)
AST: 16 U/L (ref 0–37)
Albumin: 4.3 g/dL (ref 3.5–5.2)
Alkaline Phosphatase: 42 U/L (ref 39–117)
BUN: 28 mg/dL — ABNORMAL HIGH (ref 6–23)
CO2: 30 mEq/L (ref 19–32)
Calcium: 9.5 mg/dL (ref 8.4–10.5)
Chloride: 102 mEq/L (ref 96–112)
Creatinine, Ser: 1.42 mg/dL (ref 0.40–1.50)
GFR: 49.84 mL/min — ABNORMAL LOW (ref >60.00)
Glucose, Bld: 211 mg/dL — ABNORMAL HIGH (ref 70–99)
Potassium: 4.7 mEq/L (ref 3.5–5.1)
Sodium: 143 mEq/L (ref 135–145)
Total Bilirubin: 0.6 mg/dL (ref 0.2–1.2)
Total Protein: 6.9 g/dL (ref 6.0–8.3)

## 2020-09-10 LAB — CBC WITH DIFFERENTIAL/PLATELET
Basophils Absolute: 0.1 10*3/uL (ref 0.0–0.1)
Basophils Relative: 1.2 % (ref 0.0–3.0)
Eosinophils Absolute: 0.1 10*3/uL (ref 0.0–0.7)
Eosinophils Relative: 1.6 % (ref 0.0–5.0)
HCT: 47.9 % (ref 39.0–52.0)
Hemoglobin: 16.2 g/dL (ref 13.0–17.0)
Lymphocytes Relative: 20.5 % (ref 12.0–46.0)
Lymphs Abs: 1.8 10*3/uL (ref 0.7–4.0)
MCHC: 33.8 g/dL (ref 30.0–36.0)
MCV: 89.7 fl (ref 78.0–100.0)
Monocytes Absolute: 0.8 10*3/uL (ref 0.1–1.0)
Monocytes Relative: 8.9 % (ref 3.0–12.0)
Neutro Abs: 6.1 10*3/uL (ref 1.4–7.7)
Neutrophils Relative %: 67.8 % (ref 43.0–77.0)
Platelets: 213 10*3/uL (ref 150.0–400.0)
RBC: 5.35 Mil/uL (ref 4.22–5.81)
RDW: 14.8 % (ref 11.5–15.5)
WBC: 9 10*3/uL (ref 4.0–10.5)

## 2020-09-11 ENCOUNTER — Other Ambulatory Visit (HOSPITAL_COMMUNITY): Payer: Self-pay | Admitting: Gastroenterology

## 2020-09-11 DIAGNOSIS — R930 Abnormal findings on diagnostic imaging of skull and head, not elsewhere classified: Secondary | ICD-10-CM

## 2020-09-16 ENCOUNTER — Encounter: Payer: Self-pay | Admitting: Gastroenterology

## 2020-09-16 ENCOUNTER — Ambulatory Visit: Payer: Medicare HMO | Admitting: Gastroenterology

## 2020-09-16 ENCOUNTER — Ambulatory Visit (HOSPITAL_COMMUNITY): Admission: RE | Admit: 2020-09-16 | Payer: Medicare HMO | Source: Ambulatory Visit

## 2020-09-16 VITALS — BP 112/60 | HR 80 | Ht 70.0 in | Wt 195.0 lb

## 2020-09-16 DIAGNOSIS — K909 Intestinal malabsorption, unspecified: Secondary | ICD-10-CM | POA: Diagnosis not present

## 2020-09-16 DIAGNOSIS — R194 Change in bowel habit: Secondary | ICD-10-CM

## 2020-09-16 NOTE — Patient Instructions (Signed)
If you are age 71 or older, your body mass index should be between 23-30. Your Body mass index is 27.98 kg/m. If this is out of the aforementioned range listed, please consider follow up with your Primary Care Provider.  If you are age 76 or younger, your body mass index should be between 19-25. Your Body mass index is 27.98 kg/m. If this is out of the aformentioned range listed, please consider follow up with your Primary Care Provider.   __________________________________________________________  The [facility] providers would like to encourage you to use [facility] to communicate with providers for non-urgent requests or questions.  Due to long hold times on the telephone, sending your provider a message by [facility] may be a faster and more efficient way to get a response.  Please allow 48 business hours for a response.  Please remember that this is for non-urgent requests.   Keep your upcoming MRI appointment.  Please follow up as needed

## 2020-09-16 NOTE — Progress Notes (Addendum)
[facility] Progress Note  Chief Complaint: Altered bowel habits/steatorrhea  Subjective  History: Ronald Cruz follows up today for continued trouble with altered bowel character described as greasy/oily stool.  It has been going on since approximately November of last year.  My initial office consult note from February details history of multiple medication changes for his diabetes, which he did not think were responsible for the change in bowel habits.  No source on EGD and colonoscopy with biopsies of duodenum and colon.  Negative C. difficile testing prior to that and normal fecal elastase.  Problems continued, he and I treated multiple portal messages, further stool studies obtained to rule out parasitic infection since he has foreign travel for work.  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 Patient with ongoing concerns regarding possible neoplasia and request for abdominal imaging (see initial office consult note).  MRI abdomen scheduled for later this month. _________________________ Ronald Cruz symptoms remain the same, loose stool that is sometimes greasy or a pudding consistency and with a foul odor.  He denies abdominal pain, bloating, gassiness or weight loss.  His appetite is excellent.  He remains concerned about the possibility of pancreatic cancer because it occurred in a grandparent, and he also feels that many years of diabetic medicines may have "done something to my organs".   ROS: Cardiovascular:  no chest pain Respiratory: no dyspnea  The patient's Past Medical, Family and Social History were reviewed and are on file in the EMR.  Objective:  Med list reviewed  Current Outpatient Medications:    Ascorbic Acid (VITAMIN C) 1000 MG tablet, Take 1,000 mg by mouth every evening. , Disp: , Rfl:    aspirin EC 81 MG tablet, Take 81 mg by mouth daily. Swallow whole., Disp: , Rfl:     benazepril (LOTENSIN) 20 MG tablet, Take 20 mg by mouth daily., Disp: , Rfl:    cyanocobalamin 1000 MCG tablet, Take 1,000 mcg by mouth in the morning and at bedtime. , Disp: , Rfl:    Empagliflozin-linaGLIPtin (GLYXAMBI) 25-5 MG TABS, Take 1 tablet by mouth daily., Disp: , Rfl:    furosemide (LASIX) 20 MG tablet, Take 1 tablet (20 mg total) by mouth once a week., Disp: 17 tablet, Rfl: 2   glipiZIDE (GLUCOTROL XL) 10 MG 24 hr tablet, Take 10 mg by mouth in the morning and at bedtime., Disp: , Rfl:    hydrochlorothiazide (HYDRODIURIL) 25 MG tablet, Take 25 mg by mouth daily., Disp: , Rfl:    metFORMIN (GLUCOPHAGE) 1000 MG tablet, Take 1,000 mg by mouth 2 (two) times daily with a meal., Disp: , Rfl:    potassium chloride (KLOR-CON) 10 MEQ tablet, Take 1 tablet (10 mEq total) by mouth once a week., Disp: 17 tablet, Rfl: 2   traZODone (DESYREL) 50 MG tablet, Take 50 mg by mouth at bedtime., Disp: , Rfl:    Vitamin D, Ergocalciferol, 50 MCG (2000 UT) CAPS, Take 2,000 Units by mouth daily. , Disp: , Rfl:    vitamin E 45 MG (100 UNITS) capsule, Take 100 Units by mouth every evening., Disp: , Rfl:    WHEAT GERM OIL PO, Take 1 capsule by mouth in the morning and at bedtime., Disp: , Rfl:    rosuvastatin (CRESTOR) 20 MG tablet, Take 1 tablet (20 mg total) by mouth daily., Disp: 90 tablet, Rfl: 3   Vital signs in last 24 hrs: Vitals:  09/16/20 0819  BP: 112/60  Pulse: 80   Wt Readings from Last 3 Encounters:  09/16/20 195 lb (88.5 kg)  08/18/20 197 lb (89.4 kg)  05/27/20 197 lb (89.4 kg)    Physical Exam  He is well-appearing with good muscle mass HEENT: sclera anicteric, oral mucosa moist without lesions Neck: supple, no thyromegaly, JVD or lymphadenopathy Cardiac: RRR without murmurs, S1S2 heard, no peripheral edema Pulm: clear to auscultation bilaterally, normal RR and effort noted Abdomen: soft, no tenderness, with active bowel sounds. No guarding or palpable hepatosplenomegaly. Skin;  warm and dry, no jaundice or rash  Labs:  CBC Latest Ref Rng & Units 09/10/2020 12/28/2019  WBC 4.0 - 10.5 K/uL 9.0 9.0  Hemoglobin 13.0 - 17.0 g/dL 16.2 14.3  Hematocrit 39.0 - 52.0 % 47.9 42.8  Platelets 150.0 - 400.0 K/uL 213.0 272   CMP Latest Ref Rng & Units 09/10/2020 12/26/2019 12/20/2019  Glucose 70 - 99 mg/dL 211(H) 125(H) 133(H)  BUN 6 - 23 mg/dL 28(H) 15 30(H)  Creatinine 0.40 - 1.50 mg/dL 1.42 1.15 1.56(H)  Sodium 135 - 145 mEq/L 143 140 141  Potassium 3.5 - 5.1 mEq/L 4.7 3.6 4.1  Chloride 96 - 112 mEq/L 102 105 99  CO2 19 - 32 mEq/L 30 26 25   Calcium 8.4 - 10.5 mg/dL 9.5 8.9 9.4  Total Protein 6.0 - 8.3 g/dL 6.9 - -  Total Bilirubin 0.2 - 1.2 mg/dL 0.6 - -  Alkaline Phos 39 - 117 U/L 42 - -  AST 0 - 37 U/L 16 - -  ALT 0 - 53 U/L 17 - -   Recent GI pathogen panel result on file, noted above ___________________________________________ Radiologic studies:   ____________________________________________ Other:   _____________________________________________ Assessment & Plan  Assessment: Encounter Diagnoses  Name Primary?   Change in bowel habits Yes   Steatorrhea    About 8 months of change in bowel habits with possible steatorrhea, though primary care fecal fat study reportedly normal, fecal elastase normal. No signs and symptoms of malabsorption, and selective fat malabsorption would be unusual. Ronald Cruz again mentions significant stress at his job and wondered if that could be related, and I again agreed that is possible.  He harbors concerns of cancer, and I have again tried to reassure him that the overall scenario does not suggest that.  Nevertheless, imaging should provide him some reassurance. If that MRI is normal, I will offer him a brief trial of pancreatic enzyme supplements.   22 minutes were spent on this encounter (including chart review, history/exam, counseling/coordination of care, and documentation) > 50% of that time was spent on counseling and  coordination of care.  Topics discussed included: See above.  Ronald Cruz

## 2020-09-25 ENCOUNTER — Telehealth: Payer: Self-pay

## 2020-09-25 NOTE — Telephone Encounter (Signed)
Received a call from radiology stating that MRI of the brain needed to be signed by Dr. [name]. Dr. [name] did not order MRI of the brain, only of the abdomen. Appt attached said MRI of the brain was scheduled with Ginger, there is no Ginger at our office. MRI of Brain cancelled.

## 2020-09-25 NOTE — Telephone Encounter (Signed)
Received a call from [place] at [facility] radiology stating that patient called them upset about the appt being cancelled. She states that she tried to explain to patient that we did not order this test but patient was not listening. Horris Latino asked that I try to call the patient and explain things to him.

## 2020-09-25 NOTE — Telephone Encounter (Signed)
Spoke with patient, explained to patient several times that Dr. [name] did not order MRI of the brain so the order was not signed. Advised that I am not sure how the MRI of the brain was put under Dr. [name]' name. Patient states that Dr. Coletta Memos is the one that ordered the MRI of the brain. Advised patient that he will need to call his PCP's office. Patient states that he should not have to call over there and he has spent hours trying to get the MRI's on the same date. Advised that this was not our mistake and he would need to discuss with Ginger at his PCPs office.

## 2020-09-25 NOTE — Telephone Encounter (Signed)
Spoke with Ginger at Dr. Chales Abrahams office. She states that she faxed an order to central scheduling to get patient scheduled for an MRI on the same day as his MRI abdomen. She is aware that somehow the order was entered under Dr. [name]' name and that he would not be able to sign it because he is not in the office. I am unable to authorize an order by another provider. Ginger states that she received a confirmation fax that it was sent, she states that she will refax it. I spoke with Morey Hummingbird in radiology to see if they have the order for the MRI brain. She states that they do not have the order, advised that Ginger would be faxing the order again. Morey Hummingbird states that once she receives the order she can get patient rescheduled as planned.   Awaiting a return call from Ginger to give her an update.

## 2020-09-26 ENCOUNTER — Other Ambulatory Visit (HOSPITAL_COMMUNITY): Payer: Self-pay | Admitting: Family Medicine

## 2020-09-26 ENCOUNTER — Other Ambulatory Visit (HOSPITAL_COMMUNITY): Payer: Self-pay | Admitting: Nurse Practitioner

## 2020-09-26 ENCOUNTER — Other Ambulatory Visit (HOSPITAL_COMMUNITY): Payer: Self-pay | Admitting: Gastroenterology

## 2020-09-26 ENCOUNTER — Other Ambulatory Visit: Payer: Self-pay | Admitting: Gastroenterology

## 2020-09-26 DIAGNOSIS — R930 Abnormal findings on diagnostic imaging of skull and head, not elsewhere classified: Secondary | ICD-10-CM

## 2020-09-29 ENCOUNTER — Other Ambulatory Visit: Payer: Self-pay | Admitting: Gastroenterology

## 2020-09-29 ENCOUNTER — Ambulatory Visit (HOSPITAL_COMMUNITY): Payer: Medicare HMO

## 2020-09-29 ENCOUNTER — Other Ambulatory Visit: Payer: Self-pay

## 2020-09-29 ENCOUNTER — Ambulatory Visit (HOSPITAL_COMMUNITY)
Admission: RE | Admit: 2020-09-29 | Discharge: 2020-09-29 | Disposition: A | Discharge status: Home / Self Care | Payer: Medicare HMO | Source: Ambulatory Visit | Attending: Gastroenterology | Admitting: Gastroenterology

## 2020-09-29 DIAGNOSIS — R930 Abnormal findings on diagnostic imaging of skull and head, not elsewhere classified: Secondary | ICD-10-CM | POA: Insufficient documentation

## 2020-09-29 DIAGNOSIS — K909 Intestinal malabsorption, unspecified: Secondary | ICD-10-CM

## 2020-09-29 DIAGNOSIS — R194 Change in bowel habit: Secondary | ICD-10-CM | POA: Diagnosis present

## 2020-09-29 MED ORDER — GADOBUTROL 1 MMOL/ML IV SOLN
9.0000 mL | Freq: Once | INTRAVENOUS | Status: AC | PRN
  Administered 2020-09-29: 9 mL via INTRAVENOUS

## 2020-10-01 MED ORDER — PANCRELIPASE (LIP-PROT-AMYL) 36000-114000 UNITS PO CPEP: 72000.0000 [IU] | ORAL_CAPSULE | Freq: Three times a day (TID) | ORAL | 0 refills | Status: DC

## 2020-10-02 ENCOUNTER — Other Ambulatory Visit (HOSPITAL_COMMUNITY): Payer: Self-pay | Admitting: Radiology

## 2020-10-02 DIAGNOSIS — R519 Headache, unspecified: Secondary | ICD-10-CM

## 2020-10-03 ENCOUNTER — Ambulatory Visit
Admission: RE | Admit: 2020-10-03 | Discharge: 2020-10-03 | Disposition: A | Discharge status: Home / Self Care | Payer: Medicare HMO | Source: Ambulatory Visit | Attending: Radiology | Admitting: Radiology

## 2020-10-03 DIAGNOSIS — R519 Headache, unspecified: Secondary | ICD-10-CM

## 2020-10-08 NOTE — Telephone Encounter (Signed)
10-14 days of Creon samples would be great if we have enough.  2 capsules with each meal.  He most likely does not have exocrine pancreatic insufficiency, so this will be a sufficient trial.  - HD

## 2020-11-02 NOTE — Addendum Note (Signed)
Encounter addended by: Annie Paras on: 11/02/2020 11:46 AM  Actions taken: Letter saved

## 2020-11-10 ENCOUNTER — Telehealth: Payer: Self-pay | Admitting: Cardiology

## 2020-11-10 NOTE — Telephone Encounter (Signed)
Pt c/o BP issue: STAT if pt c/o blurred vision, one-sided weakness or slurred speech  1. What are your last 5 BP readings? 100/59, 109/60 now jump up 153/80  2. Are you having any other symptoms (ex. Dizziness, headache, blurred vision, passed out)? No   3. What is your BP issue? BP going up and down.     Pt c/o medication issue:  1. Name of Medication: not sure  2. How are you currently taking this medication (dosage and times per day)? Once a day   3. Are you having a reaction (difficulty breathing--STAT)? No   4. What is your medication issue? BP     .Pt c/o swelling: STAT is pt has developed SOB within 24 hours  If swelling, where is the swelling located? Lower legs   How much weight have you gained and in what time span? Yes from swelling    Have you gained 3 pounds in a day or 5 pounds in a week? Last 3 weeks   Do you have a log of your daily weights (if so, list)? no  Are you currently taking a fluid pill? No   Are you currently SOB? No  Have you traveled recently? No

## 2020-11-11 MED ORDER — FUROSEMIDE 20 MG PO TABS: 20.0000 mg | ORAL_TABLET | ORAL | 2 refills | Status: DC

## 2020-11-11 NOTE — Telephone Encounter (Signed)
Left VM for pt to call back.

## 2020-11-11 NOTE — Telephone Encounter (Signed)
Spoke with pt who states the following are his BP readings starting 2 weeks ago. Pt did not provide dates but states the readings were after his medication. 100/59 HR 85 130/65 HR 91 102/57 HR 81 110/66 HR 70 108/59 HR 89 85/49 HR 83   Pt states he called and was told to stop his furosemide and his HCTZ. They were not removed  88/54 HR 83       from his medication list. Pt is also not taking CREON.  98/57 HR 71 107/65 HR 69 109/66 HR 85 107/57 HR 106 92/56 HR 96  Pt states the following readings are the past 3-4 days. 109/60 HR 80 163/79 HR 84 150/81 HR 70 143/83 HR 76 164/81 HR 69   Pt would like an appointment to discuss his medications. How do you advise?

## 2020-11-11 NOTE — Telephone Encounter (Signed)
Left VM to call back 

## 2020-11-11 NOTE — Addendum Note (Signed)
Addended by: Truddie Hidden on: 11/11/2020 11:49 AM   Modules accepted: Orders

## 2021-08-13 ENCOUNTER — Encounter: Payer: Self-pay | Admitting: Cardiology

## 2021-08-13 ENCOUNTER — Ambulatory Visit: Payer: Medicare HMO | Admitting: Cardiology

## 2021-08-13 VITALS — BP 118/70 | HR 77 | Ht 70.0 in | Wt 210.0 lb

## 2021-08-13 DIAGNOSIS — I2510 Atherosclerotic heart disease of native coronary artery without angina pectoris: Secondary | ICD-10-CM | POA: Diagnosis not present

## 2021-08-13 DIAGNOSIS — Z79899 Other long term (current) drug therapy: Secondary | ICD-10-CM | POA: Diagnosis not present

## 2021-08-13 DIAGNOSIS — E783 Hyperchylomicronemia: Secondary | ICD-10-CM | POA: Diagnosis not present

## 2021-08-13 DIAGNOSIS — I10 Essential (primary) hypertension: Secondary | ICD-10-CM | POA: Diagnosis not present

## 2021-08-13 DIAGNOSIS — E119 Type 2 diabetes mellitus without complications: Secondary | ICD-10-CM

## 2021-08-13 LAB — BASIC METABOLIC PANEL
BUN/Creatinine Ratio: 20 (ref 10–24)
BUN: 20 mg/dL (ref 8–27)
CO2: 25 mmol/L (ref 20–29)
Calcium: 9.4 mg/dL (ref 8.6–10.2)
Chloride: 101 mmol/L (ref 96–106)
Creatinine, Ser: 1 mg/dL (ref 0.76–1.27)
Glucose: 148 mg/dL — ABNORMAL HIGH (ref 70–99)
Potassium: 4.4 mmol/L (ref 3.5–5.2)
Sodium: 140 mmol/L (ref 134–144)
eGFR: 80 mL/min/{1.73_m2} (ref >59)

## 2021-08-13 LAB — MAGNESIUM: Magnesium: 1.9 mg/dL (ref 1.6–2.3)

## 2021-08-13 MED ORDER — OMEPRAZOLE 20 MG PO CPDR: 20.0000 mg | DELAYED_RELEASE_CAPSULE | Freq: Every day | ORAL | 11 refills | Status: DC

## 2021-08-13 MED ORDER — FUROSEMIDE 20 MG PO TABS: 20.0000 mg | ORAL_TABLET | ORAL | 3 refills | Status: DC

## 2021-08-13 MED ORDER — POTASSIUM CHLORIDE ER 10 MEQ PO TBCR: 10.0000 meq | EXTENDED_RELEASE_TABLET | ORAL | 2 refills | Status: DC

## 2021-08-13 NOTE — Patient Instructions (Signed)
Medication Instructions:  Your physician has recommended you make the following change in your medication:  START: Lasix 20 mg once weekly START: Potassium 10 mEq once weekly START: Omeprazole 20 mg once daily  *If you need a refill on your cardiac medications before your next appointment, please call your pharmacy*   Lab Work: Your physician recommends that you return for lab work in:  TODAY: BMET, [place] If you have labs (blood work) drawn today and your tests are completely normal, you will receive your results only by: [place] (if you have [place]) OR A paper copy in the mail If you have any lab test that is abnormal or we need to change your treatment, we will call you to review the results.   Testing/Procedures: None   Follow-Up: At [facility], you and your health needs are our priority.  As part of our continuing mission to provide you with exceptional heart care, we have created designated Provider Care Teams.  These Care Teams include your primary Cardiologist (physician) and Advanced Practice Providers (APPs -  Physician Assistants and Nurse Practitioners) who all work together to provide you with the care you need, when you need it.  We recommend signing up for the patient portal called "MyChart".  Sign up information is provided on this After Visit Summary.  MyChart is used to connect with patients for Virtual Visits (Telemedicine).  Patients are able to view lab/test results, encounter notes, upcoming appointments, etc.  Non-urgent messages can be sent to your provider as well.   To learn more about what you can do with MyChart, go to [email or web address].    Your next appointment:   6 month(s)  The format for your next appointment:   In Person  Provider:   Berniece Salines, DO     Other Instructions   Important Information About Sugar

## 2021-08-13 NOTE — Progress Notes (Signed)
Cardiology Office Note:    Date:  08/15/2021   ID:  Ronald Cruz, DOB 07/13/1950, MRN [id number]  PCP:  Bernerd Limbo, MD  Cardiologist:  Berniece Salines, DO  Electrophysiologist:  None   Referring MD: Bernerd Limbo, MD   " I am doing okay"  History of Present Illness:    Ronald Cruz is a 71 y.o. male with a hx of  coronary artery disease, hypertension, hyperlipidemia, diabetes is here today for follow-up visit.  I last saw the patient on February 06, 2020 at that time he appeared to be doing well from a cardiovascular standpoint.  He had chest completed heart catheterization where he had mild to moderate nonobstructive coronary artery disease.  I did see the patient on August 18, 2020 at that time he was doing well from a cardiovascular standpoint had had some transient hypotension which we cut back on his Lasix to once a day.  Since have seen the patient was taking him off the diuretics.  He tells me recently he has been having some increasing weight gain and thinks that is all fluid weight.   He has questions about his calcium scoring.  He tells me that he does have symptoms intermittently which feels like a burning sensation.  But once he started omeprazole this improved.  Past Medical History:  Diagnosis Date   Acute pain of left shoulder 03/30/2017   Allergic rhinitis 08/19/2012   ASCVD (arteriosclerotic cardiovascular disease) 10/24/2019   Cervical spondylosis 07/20/2010   Chronic midline low back pain without sciatica 08/28/2019   Last Assessment & Plan:  Formatting of this note might be different from the original. This pain is chronic, but he does not take medication, nor have any radicular symptoms.  Because he can have multiple diagnostic imaging studies performed on the same day for a single co-pay, he desires MRI of the lumbar spine to be scheduled along with previously planned MRI of the brain.  MRI and plain films o   Contact dermatitis and eczema due to plant 06/28/2012    Formatting of this note might be different from the original. Overview:  IMPRESSION: Topical itch creams advised also.  Steroid will affect blood sugar of next few days. Formatting of this note might be different from the original. IMPRESSION: Topical itch creams advised also.  Steroid will affect blood sugar of next few days. Formatting of this note might be different from the original. 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 Overview:  IMPRESSION: Pt's Levitra has not worked. We will change to Viagra. Welbutrin usually does not affect erection. It could be more a result of his DM Formatting of this note might be different from the original. IMPRESSION: Pt's Levitra has not worked. We will change to Viagra. Welbutrin usually does not affect erection. It coul   Essential hypertension 08/19/2012   Herpes zoster 05/02/2013   Formatting of this note might be different from the original. Overview:  STORY: rash since sunday, post 72 hours window for antiviral therapy. `E1o3L`pain described is consistant with shingles, the rash is not vesicular, but has been scratching areas.`E1o3L`IMPRESSION: pain bearable. will watch and start neurontin if needed Formatting of this note might be different from the original. STORY: rash    Hyperlipidemia 10/24/2019   Insomnia    Lump in neck 08/28/2019   Formatting of this note might be  different from the original. RIGHT, just posterior to the angle of the mandible.  Nontender.  Soft and mobile.  Last Assessment & Plan:  Formatting of this note might be different from the original. Right, just posterior to the angle of the mandible.  Nontender.  Soft and mobile.  This is not concerning for neoplasm.   Memory changes 08/21/2019   Mixed hyperlipidemia 05/02/2013   Formatting of this note might be different from the original. Overview:  IMPRESSION:  The goal is to have your total cholesterol < 200, the HDL (good cholesterol) >40, and the LDL (bad cholesterol) <100.`E1o3L`It is recomended that you follow a good low fat diet and exercise for 30 minutes 3-4 times a week. Formatting of this note might be different from the original. 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 Overview:  IMPRESSION: The goal is for the Hgb A1C to be less than 7.0.`E1o3L`It is recommended that all diatetics are educated on and follow a healthy diabetic diet, exercise for 30 minutes 3-4 times per week (walking, biking, swimming, or machine), monitor blood glucose readings and bring that record with you to be reviewed at your ne   Vitamin D deficiency 08/18/2011   Formatting of this note might be different from the original. Overview:  IMPRESSION: patient taking Vitamin D 2000IU per day. His levels are normal. Continue same dose Formatting of this note might be different from the original. IMPRESSION: patient taking Vitamin D 2000IU per day. His levels are normal. Continue same dose Formatting of this note might be different from the original. Overview:  Ov    Past Surgical History:  Procedure Laterality Date   HAND SURGERY Right    Thumb   HERNIA REPAIR     LEFT HEART CATH AND CORONARY ANGIOGRAPHY N/A 01/04/2020   Procedure: LEFT HEART CATH AND  CORONARY ANGIOGRAPHY;  Surgeon: Burnell Blanks, MD;  Location: [facility];  Service: Cardiovascular;  Laterality: N/A;    Current Medications: Current Meds  Medication Sig   Ascorbic Acid (VITAMIN C) 1000 MG tablet Take 1,000 mg by mouth every evening.    aspirin EC 81 MG tablet Take 81 mg by mouth daily. Swallow whole.   benazepril (LOTENSIN) 20 MG tablet Take 20 mg by mouth daily.   cyanocobalamin 1000 MCG tablet Take 1,000 mcg by mouth in the morning and at bedtime.    Empagliflozin-linaGLIPtin (GLYXAMBI) 25-5 MG TABS Take 1 tablet by mouth daily.   furosemide (LASIX) 20 MG tablet Take 1 tablet (20 mg total) by mouth once a week.   glipiZIDE (GLUCOTROL XL) 10 MG 24 hr tablet Take 10 mg by mouth in the morning and at bedtime.   metFORMIN (GLUCOPHAGE) 1000 MG tablet Take 1,000 mg by mouth 2 (two) times daily with a meal.   omeprazole (PRILOSEC) 20 MG capsule Take 1  capsule (20 mg total) by mouth daily.   rosuvastatin (CRESTOR) 20 MG tablet Take 1 tablet (20 mg total) by mouth daily.   Vitamin D, Ergocalciferol, 50 MCG (2000 UT) CAPS Take 2,000 Units by mouth daily.    vitamin E 45 MG (100 UNITS) capsule Take 100 Units by mouth every evening.   WHEAT GERM OIL PO Take 1 capsule by mouth in the morning and at bedtime.     Allergies:   Patient has no known allergies.   Social History   Socioeconomic History   Marital status: Married    Spouse name: Not on file   Number of children: Not on file   Years of education: Not on file   Highest education level: Not on file  Occupational History   Not on file  Tobacco Use   Smoking status: Never   Smokeless tobacco: Never  Substance and Sexual Activity   Alcohol use: Yes    Comment: beer once or twice a month   Drug use: Never   Sexual activity: Not on file  Other Topics Concern   Not on file  Social History Narrative   Not on file   Social Determinants of Health   Financial Resource Strain: Not on file  Food  Insecurity: Not on file  Transportation Needs: Not on file  Physical Activity: Not on file  Stress: Not on file  Social Connections: Not on file     Family History: The patient's family history includes Cancer in his paternal grandfather; Diabetes in his father, maternal uncle, maternal uncle, paternal aunt, and paternal uncle; Diverticulosis in his father, sister, and sister; Esophageal cancer in his mother; Irritable bowel syndrome in his father and sister; Pancreatic cancer in his maternal grandfather; Stroke in his father. There is no history of Colon cancer, Stomach cancer, or Rectal cancer.  ROS:   Review of Systems  Constitution: Negative for decreased appetite, fever and weight gain.  HENT: Negative for congestion, ear discharge, hoarse voice and sore throat.   Eyes: Negative for discharge, redness, vision loss in right eye and visual halos.  Cardiovascular: Negative for chest pain, dyspnea on exertion, leg swelling, orthopnea and palpitations.  Respiratory: Negative for cough, hemoptysis, shortness of breath and snoring.   Endocrine: Negative for heat intolerance and polyphagia.  Hematologic/Lymphatic: Negative for bleeding problem. Does not bruise/bleed easily.  Skin: Negative for flushing, nail changes, rash and suspicious lesions.  Musculoskeletal: Negative for arthritis, joint pain, muscle cramps, myalgias, neck pain and stiffness.  Gastrointestinal: Negative for abdominal pain, bowel incontinence, diarrhea and excessive appetite.  Genitourinary: Negative for decreased libido, genital sores and incomplete emptying.  Neurological: Negative for brief paralysis, focal weakness, headaches and loss of balance.  Psychiatric/Behavioral: Negative for altered mental status, depression and suicidal ideas.  Allergic/Immunologic: Negative for HIV exposure and persistent infections.    EKGs/Labs/Other Studies Reviewed:    The following studies were reviewed today:   EKG:  The ekg  ordered today demonstrates   January 04, 2020 Left catheterizationProx RCA lesion is 30% stenosed. Ost LM to Mid LM lesion is 20% stenosed. Ost Cx to Prox Cx lesion is 20% stenosed. Dist LM to Prox LAD lesion is 30% stenosed. LPDA lesion is 70% stenosed.   1. Mild distal left main stenosis. This lesion is calcified 2. Mild ostial LAD stenosis. This lesion is calcified 3. Mild ostial Circumflex stenosis. This lesion is calcified. The most distal small obtuse marginal branch has a moderate, eccentric stenosis.  4. The RCA  is a small to moderate caliber non-dominant artery with mild plaque in the proximal and mid segments.    Recommendations: Medical management of mild non-obstructive CAD.     Transthoracic echocardiogram 11/27/2019 IMPRESSIONS   1. Left ventricular ejection fraction, by estimation, is 55 to 60%. The  left ventricle has normal function. The left ventricle has no regional  wall motion abnormalities. Left ventricular diastolic parameters are  consistent with Grade I diastolic  dysfunction (impaired relaxation).   2. Right ventricular systolic function is normal. The right ventricular  size is normal. Tricuspid regurgitation signal is inadequate for assessing  PA pressure.   3. The mitral valve is grossly normal. Trivial mitral valve  regurgitation. No evidence of mitral stenosis.   4. The aortic valve is tricuspid. Aortic valve regurgitation is not  visualized. No aortic stenosis is present.   5. The inferior vena cava is normal in size with greater than 50%  respiratory variability, suggesting right atrial pressure of 3 mmHg.   Conclusion(s)/Recommendation(s): Normal biventricular function without  evidence of hemodynamically significant valvular heart disease.   FINDINGS   Left Ventricle: Left ventricular ejection fraction, by estimation, is 55  to 60%. The left ventricle has normal function. The left ventricle has no  regional wall motion abnormalities. Definity  contrast agent was given IV  to delineate the left ventricular   endocardial borders. The left ventricular internal cavity size was normal  in size. There is no left ventricular hypertrophy. Left ventricular  diastolic parameters are consistent with Grade I diastolic dysfunction  (impaired relaxation).   Right Ventricle: The right ventricular size is normal. No increase in  right ventricular wall thickness. Right ventricular systolic function is  normal. Tricuspid regurgitation signal is inadequate for assessing PA  pressure.   Left Atrium: Left atrial size was normal in size.   Right Atrium: Right atrial size was normal in size.   Pericardium: Trivial pericardial effusion is present.   Mitral Valve: The mitral valve is grossly normal. Trivial mitral valve  regurgitation. No evidence of mitral valve stenosis.   Tricuspid Valve: The tricuspid valve is grossly normal. Tricuspid valve  regurgitation is not demonstrated. No evidence of tricuspid stenosis.   Aortic Valve: The aortic valve is tricuspid. Aortic valve regurgitation is  not visualized. No aortic stenosis is present.   Pulmonic Valve: The pulmonic valve was grossly normal. Pulmonic valve  regurgitation is trivial. No evidence of pulmonic stenosis.   Aorta: The aortic root and ascending aorta are structurally normal, with  no evidence of dilitation.   Venous: The inferior vena cava is normal in size with greater than 50%  respiratory variability, suggesting right atrial pressure of 3 mmHg.   IAS/Shunts: The atrial septum is grossly normal.     Recent Labs: 09/10/2020: ALT 17; Hemoglobin 16.2; Platelets 213.0 08/13/2021: BUN 20; Creatinine, Ser 1.00; Magnesium 1.9; Potassium 4.4; Sodium 140  Recent Lipid Panel No results found for: CHOL, TRIG, HDL, CHOLHDL, VLDL, LDLCALC, LDLDIRECT  Physical Exam:    VS:  BP 118/70   Pulse 77   Ht '5\' 10"'$  (1.778 m)   Wt 210 lb (95.3 kg)   SpO2 99%   BMI 30.13 kg/m     Wt  Readings from Last 3 Encounters:  08/13/21 210 lb (95.3 kg)  09/16/20 195 lb (88.5 kg)  08/18/20 197 lb (89.4 kg)     GEN: Well nourished, well developed in no acute distress HEENT: Normal NECK: No JVD; No carotid bruits LYMPHATICS: No lymphadenopathy  CARDIAC: S1S2 noted,RRR, no murmurs, rubs, gallops RESPIRATORY:  Clear to auscultation without rales, wheezing or rhonchi  ABDOMEN: Soft, non-tender, non-distended, +bowel sounds, no guarding. EXTREMITIES: +2 bilateral lower extremity edema, No cyanosis, no clubbing MUSCULOSKELETAL:  No deformity  SKIN: Warm and dry NEUROLOGIC:  Alert and oriented x 3, non-focal PSYCHIATRIC:  Normal affect, good insight  ASSESSMENT:    1. Medication management   2. Coronary artery disease involving native coronary artery of native heart without angina pectoris   3. Type 2 diabetes mellitus without complication, without long-term current use of insulin ([place])   4. Mixed hyperglyceridemia   5. Primary hypertension    PLAN:     CAD-no angina symptoms.  We will continue the patient on his current aspirin and statin dose.  He had questions about any repeat testing.  I explained to the patient that there will be no change in management plan.  He is not having any angina symptoms at this time.  And if he develops angina we will start antianginals and likely do functional testing like a nuclear stress test to assess for ischemia.  He is currently at edema which is significant.  We had taken him off his Lasix previously due to hypotension.  But I like to introduce his Lasix once weekly to see if this is going to help.   Hyperlipidemia - continue with current statin medication.  He is having symptoms which sounds to be GERD.  I have encouraged the patient to continue on his PPI.   The patient is in agreement with the above plan. The patient left the office in stable condition.  The patient will follow up in 6 months.    Medication Adjustments/Labs and  Tests Ordered: Current medicines are reviewed at length with the patient today.  Concerns regarding medicines are outlined above.  Orders Placed This Encounter  Procedures   Basic Metabolic Panel (BMET)   Magnesium   Meds ordered this encounter  Medications   omeprazole (PRILOSEC) 20 MG capsule    Sig: Take 1 capsule (20 mg total) by mouth daily.    Dispense:  30 capsule    Refill:  11   furosemide (LASIX) 20 MG tablet    Sig: Take 1 tablet (20 mg total) by mouth once a week.    Dispense:  13 tablet    Refill:  3   potassium chloride (KLOR-CON) 10 MEQ tablet    Sig: Take 1 tablet (10 mEq total) by mouth once a week.    Dispense:  17 tablet    Refill:  2    Patient Instructions  Medication Instructions:  Your physician has recommended you make the following change in your medication:  START: Lasix 20 mg once weekly START: Potassium 10 mEq once weekly START: Omeprazole 20 mg once daily  *If you need a refill on your cardiac medications before your next appointment, please call your pharmacy*   Lab Work: Your physician recommends that you return for lab work in:  TODAY: BMET, [place] If you have labs (blood work) drawn today and your tests are completely normal, you will receive your results only by: [place] (if you have [place]) OR A paper copy in the mail If you have any lab test that is abnormal or we need to change your treatment, we will call you to review the results.   Testing/Procedures: None   Follow-Up: At [facility], you and your health needs are our priority.  As part of our continuing  mission to provide you with exceptional heart care, we have created designated Provider Care Teams.  These Care Teams include your primary Cardiologist (physician) and Advanced Practice Providers (APPs -  Physician Assistants and Nurse Practitioners) who all work together to provide you with the care you need, when you need it.  We recommend signing up for the patient  portal called "MyChart".  Sign up information is provided on this After Visit Summary.  MyChart is used to connect with patients for Virtual Visits (Telemedicine).  Patients are able to view lab/test results, encounter notes, upcoming appointments, etc.  Non-urgent messages can be sent to your provider as well.   To learn more about what you can do with MyChart, go to [email or web address].    Your next appointment:   6 month(s)  The format for your next appointment:   In Person  Provider:   Berniece Salines, DO     Other Instructions   Important Information About Sugar         Adopting a Healthy Lifestyle.  Know what a healthy weight is for you (roughly BMI <25) and aim to maintain this   Aim for 7+ servings of fruits and vegetables daily   65-80+ fluid ounces of water or unsweet tea for healthy kidneys   Limit to max 1 drink of alcohol per day; avoid smoking/tobacco   Limit animal fats in diet for cholesterol and heart health - choose grass fed whenever available   Avoid highly processed foods, and foods high in saturated/trans fats   Aim for low stress - take time to unwind and care for your mental health   Aim for 150 min of moderate intensity exercise weekly for heart health, and weights twice weekly for bone health   Aim for 7-9 hours of sleep daily   When it comes to diets, agreement about the perfect plan isnt easy to find, even among the experts. Experts at the [place] developed an idea known as the Healthy Eating Plate. Just imagine a plate divided into logical, healthy portions.   The emphasis is on diet quality:   Load up on vegetables and fruits - one-half of your plate: Aim for color and variety, and remember that potatoes dont count.   Go for whole grains - one-quarter of your plate: Whole wheat, barley, wheat berries, quinoa, oats, brown rice, and foods made with them. If you want pasta, go with whole wheat pasta.   Protein  power - one-quarter of your plate: Fish, chicken, beans, and nuts are all healthy, versatile protein sources. Limit red meat.   The diet, however, does go beyond the plate, offering a few other suggestions.   Use healthy plant oils, such as olive, canola, soy, corn, sunflower and peanut. Check the labels, and avoid partially hydrogenated oil, which have unhealthy trans fats.   If youre thirsty, drink water. Coffee and tea are good in moderation, but skip sugary drinks and limit milk and dairy products to one or two daily servings.   The type of carbohydrate in the diet is more important than the amount. Some sources of carbohydrates, such as vegetables, fruits, whole grains, and beans-are healthier than others.   Finally, stay active  Signed, Berniece Salines, DO  08/15/2021 8:09 PM    [place]

## 2021-08-14 ENCOUNTER — Encounter: Payer: Self-pay | Admitting: Cardiology

## 2021-08-15 DIAGNOSIS — E783 Hyperchylomicronemia: Secondary | ICD-10-CM | POA: Insufficient documentation

## 2021-08-15 DIAGNOSIS — Z79899 Other long term (current) drug therapy: Secondary | ICD-10-CM | POA: Insufficient documentation

## 2021-08-15 DIAGNOSIS — I10 Essential (primary) hypertension: Secondary | ICD-10-CM | POA: Insufficient documentation

## 2021-08-18 NOTE — Addendum Note (Signed)
Addended by: Angelena Form on: 08/18/2021 09:35 AM   Modules accepted: Orders

## 2021-09-22 ENCOUNTER — Encounter: Payer: Self-pay | Admitting: Cardiology

## 2021-11-04 IMAGING — CT CT CARDIAC CORONARY ARTERY CALCIUM SCORE
3 series · 14 of 20 positions shown, 15 images · non-contrast
Comparison: None.
COMPARISON: None.

Addendum:
EXAM:
OVER-READ INTERPRETATION  CT CHEST

The following report is an over-read performed by radiologist Dr.
Emilly Celik [REDACTED] on 11/27/2019. This
over-read does not include interpretation of cardiac or coronary
anatomy or pathology. The coronary calcium score/coronary CTA
interpretation by the cardiologist is attached.
CLINICAL DATA: Risk stratification
Coronary Calcium Score
TECHNIQUE: The patient was scanned on a Siemens Force scanner. Axial
non-contrast 3 mm slices were carried out through the heart. The
data set was analyzed on a dedicated work station and scored using
the Agatson method.

[Series 2: casc 3.0 bv41 2 bestdiast 68 % · axial · 0.41mm/px · z∈[-213,-129]mm · 4 of 48 slices shown, 5 images]
[im 10/48  vessel]
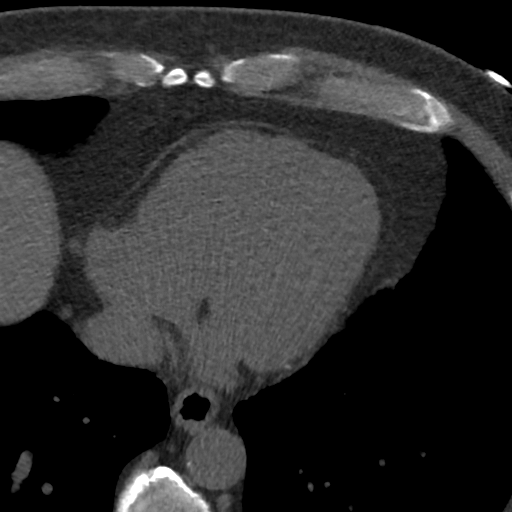
[im 10/48  lung]
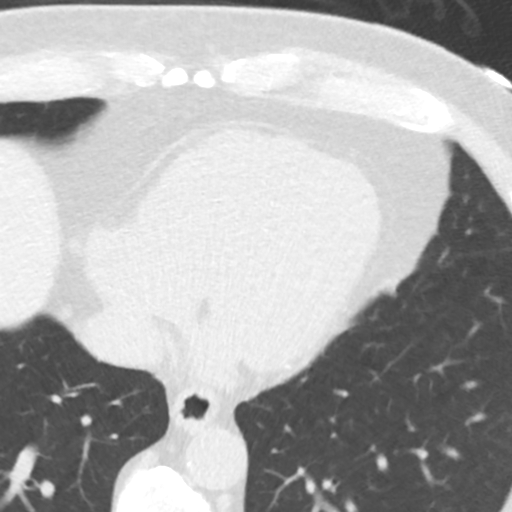
[im 19/48  vessel]
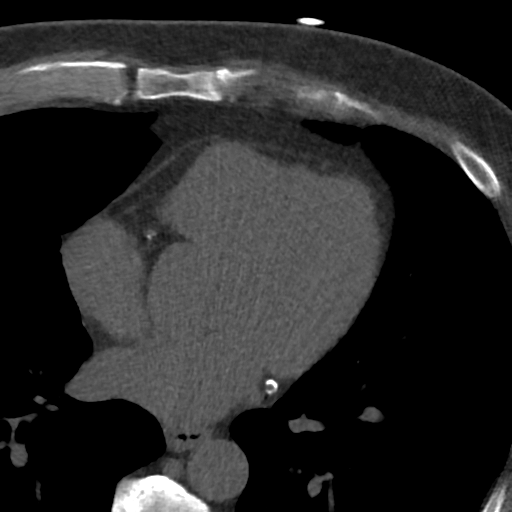
[im 29/48  vessel]
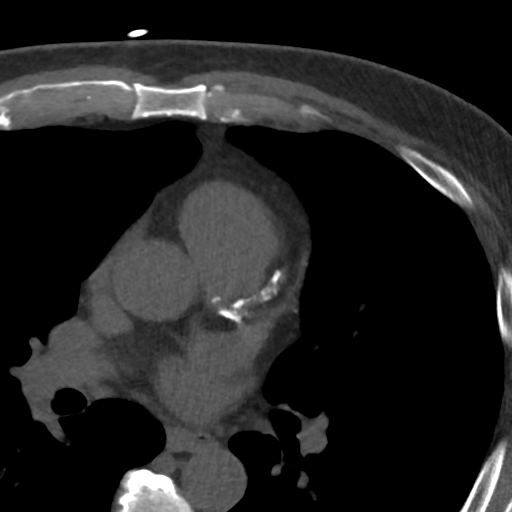
[im 38/48  vessel]
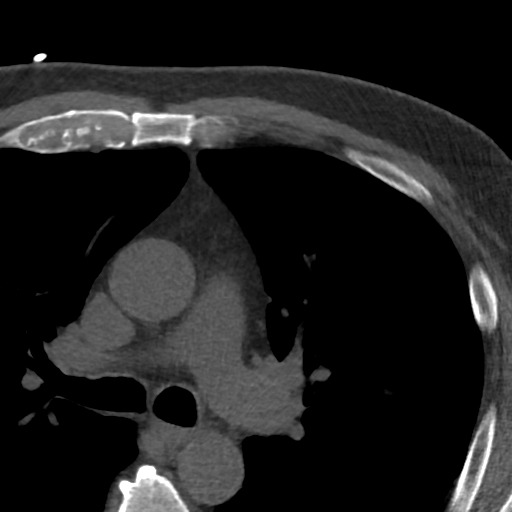

[Series 3: lung 68 % · axial · 0.71mm/px · z∈[-219,-123]mm · 5 of 48 slices shown]
[im 8/48  lung]
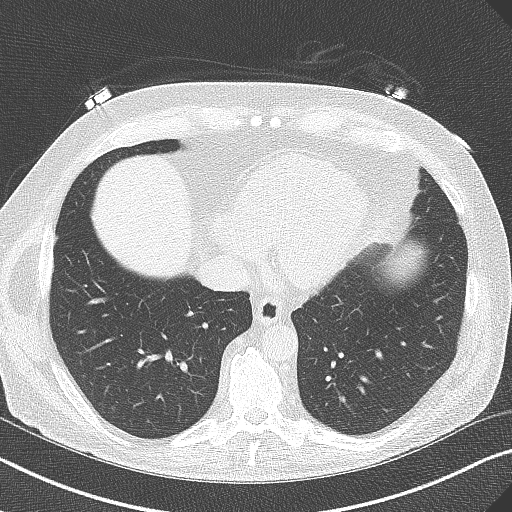
[im 16/48  lung]
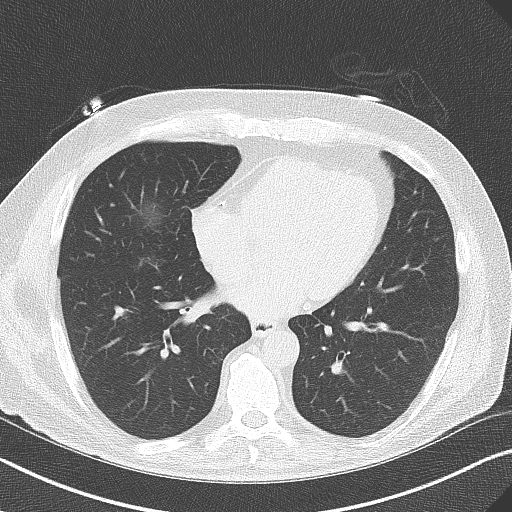
[im 24/48  lung]
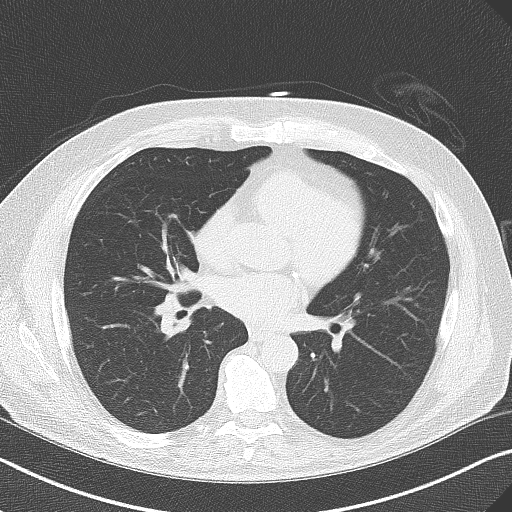
[im 32/48  lung]
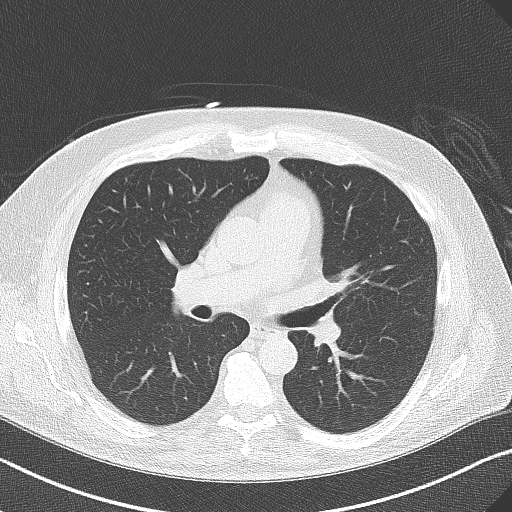
[im 40/48  lung]
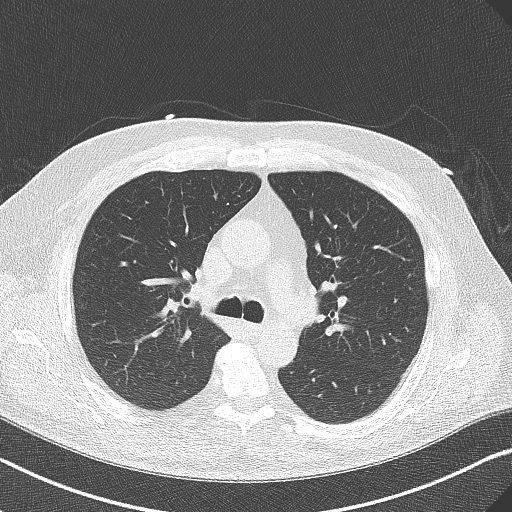

[Series 4: lung st 68 % · axial · 0.71mm/px · z∈[-219,-123]mm · 5 of 48 slices shown]
[im 8/48  lung]
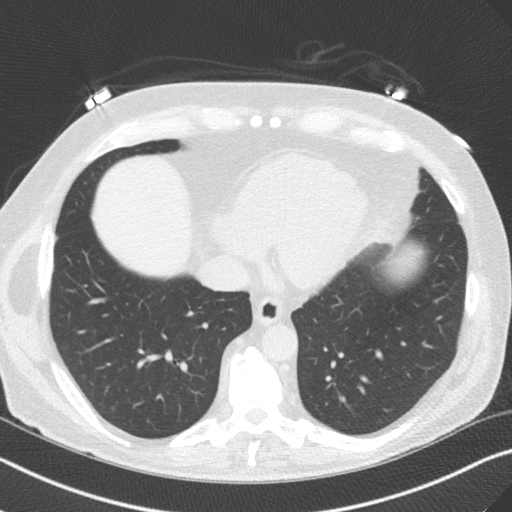
[im 16/48  lung]
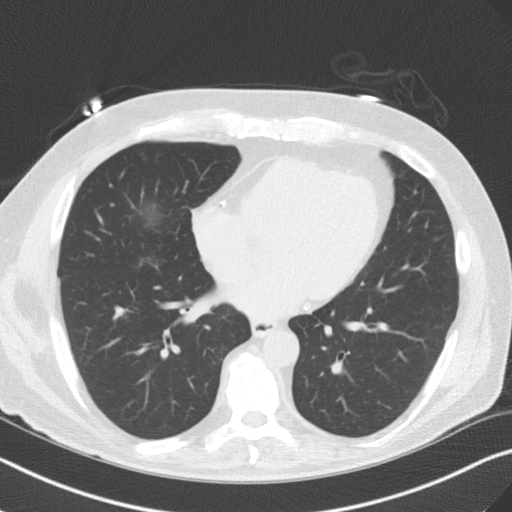
[im 24/48  lung]
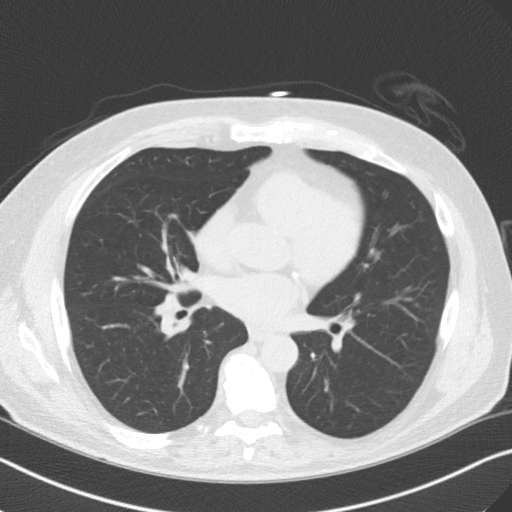
[im 32/48  lung]
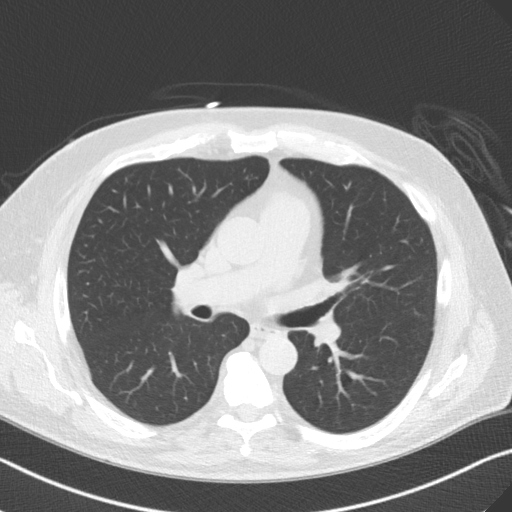
[im 40/48  lung]
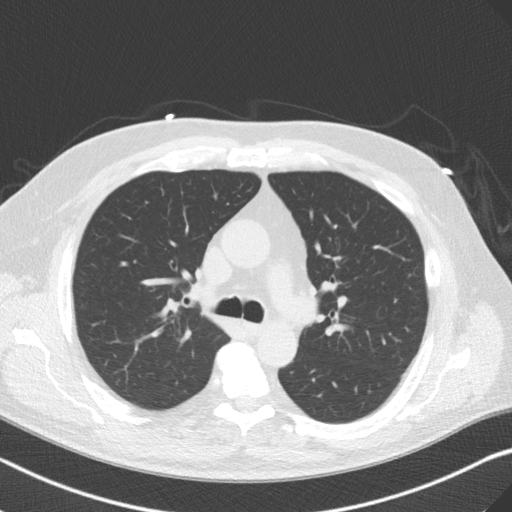

[14 of 20 positions shown; findings below may reference images not displayed]

FINDINGS: Aortic atherosclerosis. Within the visualized portions of the thorax
there are no suspicious appearing pulmonary nodules or masses, there
is no acute consolidative airspace disease, no pleural effusions, no
pneumothorax and no lymphadenopathy. Visualized portions of the
upper abdomen are unremarkable. There are no aggressive appearing
lytic or blastic lesions noted in the visualized portions of the
skeleton.
IMPRESSION: 1.  Aortic Atherosclerosis (YVZ84-NCT.T).
FINDINGS: Non-cardiac: See separate report from [REDACTED].

Ascending Aorta: Normal

Pericardium: Normal

Coronary arteries: Normal origin.
IMPRESSION: Coronary calcium score of 6417. This was 99 percentile for age and
sex matched control.

Mamunitis [employer],DO

*** End of Addendum ***
EXAM:
OVER-READ INTERPRETATION  CT CHEST

The following report is an over-read performed by radiologist Dr.
Emilly Celik [REDACTED] on 11/27/2019. This
over-read does not include interpretation of cardiac or coronary
anatomy or pathology. The coronary calcium score/coronary CTA
interpretation by the cardiologist is attached.
FINDINGS: Aortic atherosclerosis. Within the visualized portions of the thorax
there are no suspicious appearing pulmonary nodules or masses, there
is no acute consolidative airspace disease, no pleural effusions, no
pneumothorax and no lymphadenopathy. Visualized portions of the
upper abdomen are unremarkable. There are no aggressive appearing
lytic or blastic lesions noted in the visualized portions of the
skeleton.
IMPRESSION: 1.  Aortic Atherosclerosis (YVZ84-NCT.T).

## 2021-11-11 ENCOUNTER — Encounter: Payer: Self-pay | Admitting: Primary Care

## 2021-11-11 ENCOUNTER — Ambulatory Visit (INDEPENDENT_AMBULATORY_CARE_PROVIDER_SITE_OTHER): Payer: Medicare HMO | Admitting: Primary Care

## 2021-11-11 VITALS — BP 118/70 | HR 96 | Temp 98.2°F | Ht 70.0 in | Wt 210.8 lb

## 2021-11-11 DIAGNOSIS — G478 Other sleep disorders: Secondary | ICD-10-CM

## 2021-11-11 DIAGNOSIS — G4700 Insomnia, unspecified: Secondary | ICD-10-CM | POA: Diagnosis not present

## 2021-11-11 DIAGNOSIS — R0683 Snoring: Secondary | ICD-10-CM

## 2021-11-11 NOTE — Assessment & Plan Note (Signed)
-   Patient has symptoms of restless/disruptive sleep, snoring and daytime sleepiness. Epworth score 5.  Patient is at risk for sleep apnea d/t BMI, needs home sleep study to evaluate.  Reviewed risks of untreated sleep apnea including cardiac arrhythmias, pulmonary hypertension, stroke and diabetes.  We discussed treatment options including weight loss, oral appliance, CPAP therapy or referral to ENT for possible surgical options.  If sleep study is negative, focus on sleep hygiene.  Encourage patient focus on side sleeping position or elevate head 30 degrees.  Advised against driving if experiencing excessive daytime sleepiness or fatigue.  Follow-up 1 to 2 weeks after sleep study review results and treatment options if needed.

## 2021-11-11 NOTE — Patient Instructions (Signed)
Sleep apnea is defined as period of 10 seconds or longer when you stop breathing at night. This can happen multiple times a night. Dx sleep apnea is when this occurs more than 5 times an hour.    Mild OSA 5-15 apneic events an hour Moderate OSA 15-30 apneic events an hour Severe OSA > 30 apneic events an hour   Untreated sleep apnea puts you at higher risk for cardiac arrhythmias, pulmonary HTN, stroke and diabetes   Treatment options include weight loss, side sleeping position, oral appliance, CPAP therapy or referral to ENT for possible surgical options    Recommendations: Focus on side sleeping position or elevate head 30 degrees with wedge pillow to cut down on potential apneas  Do not drive if experiencing excessive daytime sleepiness of fatigue  Discuss Norvasc dose and leg swelling with PCP    Orders: Home sleep study re: loud snoring (ordered)   Follow-up: Please schedule virtual follow-up 1-2 weeks after completing home sleep study to review results and treatment if needed

## 2021-11-11 NOTE — Progress Notes (Signed)
Reviewed and agree with assessment/plan.   Chesley Mires, MD [facility]monary/Critical [facility] 11/11/2021, 12:39 PM Pager:  [phone]

## 2021-11-11 NOTE — Progress Notes (Signed)
$'@Patient'k$  ID: Ronald Cruz, male    DOB: [birth year], 71 y.o.   MRN: [id number]  Chief Complaint  Patient presents with   Consult    Referring provider: Bernerd Limbo, MD  HPI: 71 year old male, never smoked.  Past medical history significant for coronary artery disease, hypertension, allergic rhinitis, type 2 diabetes, hyperlipidemia.  11/11/2021 Patient presents today for sleep consult.  He has symptoms of disrupted and restless sleep. He snores some. Sleep is very elusive for him. He works on the [employer] late. He has arthritis pain in his back and left shoulder. It is hard for him to get comfortable, he feels it is probably more of a physical issue causing his disrupted sleep then mental. He had been on trazodone for 8-10 years and weaned off of that d/t side effects from sleep medicine. He tried z-quil and slept well but woke up in a fog. He does not want to be on medication. Typical bedtime is between 12 AM and 1:30 AM.  It takes him 15 to 30 minutes to fall asleep.  He wakes up on average 2-4 times a night. He has no trouble getting back to sleep if he wakes up at night  He starts his day around 8 AM. He is concerned that sleep apnea could be contributing to his poor sleep quality. He sleeps in a separate bedroom from his wife.  Sleep questionnaire Symptoms-  restless sleep, snoring, daytime fatigue Prior sleep study- None Bedtime- 12am-1:30am Time to fall asleep- 15-30 mins  Nocturnal awakenings- 2-4 times Out of bed/start of day- 8am Weight changes- 5 lbs Do you operate heavy machinery- No Do you currently wear CPAP- No Do you current wear oxygen- No Epworth- 5   No Known Allergies  Immunization History  Administered Date(s) Administered   Hepatitis A, Adult 01/09/2020   Hepb-cpg 01/09/2020   Influenza-Unspecified 12/26/2018   PFIZER(Purple Top)SARS-COV-2 Vaccination 05/06/2019, 05/29/2019, 12/20/2019    Past Medical History:  Diagnosis Date   Acute pain of  left shoulder 03/30/2017   Allergic rhinitis 08/19/2012   ASCVD (arteriosclerotic cardiovascular disease) 10/24/2019   Cervical spondylosis 07/20/2010   Chronic midline low back pain without sciatica 08/28/2019   Last Assessment & Plan:  Formatting of this note might be different from the original. This pain is chronic, but he does not take medication, nor have any radicular symptoms.  Because he can have multiple diagnostic imaging studies performed on the same day for a single co-pay, he desires MRI of the lumbar spine to be scheduled along with previously planned MRI of the brain.  MRI and plain films o   Contact dermatitis and eczema due to plant 06/28/2012   Formatting of this note might be different from the original. Overview:  IMPRESSION: Topical itch creams advised also.  Steroid will affect blood sugar of next few days. Formatting of this note might be different from the original. IMPRESSION: Topical itch creams advised also.  Steroid will affect blood sugar of next few days. Formatting of this note might be different from the original. 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 Overview:  IMPRESSION: Pt's Levitra has not worked. We will change to Viagra. Welbutrin usually does not affect erection. It could be more a result of his DM Formatting of this note might be different from the original. IMPRESSION: Pt's Levitra has not worked.  We will change to Viagra. Welbutrin usually does not affect erection. It coul   Essential hypertension 08/19/2012   Herpes zoster 05/02/2013   Formatting of this note might be different from the original. Overview:  STORY: rash since sunday, post 72 hours window for antiviral therapy. `E1o3L`pain described is consistant with shingles, the rash is not vesicular, but has been scratching areas.`E1o3L`IMPRESSION: pain bearable. will watch and  start neurontin if needed Formatting of this note might be different from the original. STORY: rash    Hyperlipidemia 10/24/2019   Insomnia    Lump in neck 08/28/2019   Formatting of this note might be different from the original. RIGHT, just posterior to the angle of the mandible.  Nontender.  Soft and mobile.  Last Assessment & Plan:  Formatting of this note might be different from the original. Right, just posterior to the angle of the mandible.  Nontender.  Soft and mobile.  This is not concerning for neoplasm.   Memory changes 08/21/2019   Mixed hyperlipidemia 05/02/2013   Formatting of this note might be different from the original. Overview:  IMPRESSION: The goal is to have your total cholesterol < 200, the HDL (good cholesterol) >40, and the LDL (bad cholesterol) <100.`E1o3L`It is recomended that you follow a good low fat diet and exercise for 30 minutes 3-4 times a week. Formatting of this note might be different from the original. 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 Overview:  IMPRESSION: The goal is for the Hgb A1C to be less than 7.0.`E1o3L`It is recommended that all diatetics are educated on and follow a healthy diabetic diet, exercise for 30 minutes 3-4 times per week (walking, biking, swimming, or machine), monitor blood glucose readings and bring that record  with you to be reviewed at your ne   Vitamin D deficiency 08/18/2011   Formatting of this note might be different from the original. Overview:  IMPRESSION: patient taking Vitamin D 2000IU per day. His levels are normal. Continue same dose Formatting of this note might be different from the original. IMPRESSION: patient taking Vitamin D 2000IU per day. His levels are normal. Continue same dose Formatting of this note might be different from the original. Overview:  Ov    Tobacco History: Social History   Tobacco Use  Smoking Status Never   Passive exposure: Past  Smokeless Tobacco Never   Counseling given: Not Answered   Outpatient Medications Prior to Visit  Medication Sig Dispense Refill   amLODipine (NORVASC) 10 MG tablet amlodipine 10 mg tablet  TAKE 1 TABLET BY MOUTH ONCE DAILY     Ascorbic Acid (VITAMIN C) 1000 MG tablet Take 1,000 mg by mouth every evening.      aspirin EC 81 MG tablet Take 81 mg by mouth daily. Swallow whole.     benazepril (LOTENSIN) 20 MG tablet Take  20 mg by mouth daily.     cyanocobalamin 1000 MCG tablet Take 1,000 mcg by mouth in the morning and at bedtime.      Empagliflozin-linaGLIPtin (GLYXAMBI) 25-5 MG TABS Take 1 tablet by mouth daily.     furosemide (LASIX) 20 MG tablet Take 1 tablet (20 mg total) by mouth once a week. 13 tablet 3   glipiZIDE (GLUCOTROL XL) 10 MG 24 hr tablet Take 10 mg by mouth in the morning and at bedtime.     metFORMIN (GLUCOPHAGE) 1000 MG tablet Take 1,000 mg by mouth 2 (two) times daily with a meal.     naproxen (NAPROSYN) 500 MG tablet Take 500 mg by mouth 2 (two) times daily.     omeprazole (PRILOSEC) 20 MG capsule Take 1 capsule (20 mg total) by mouth daily. 30 capsule 11   potassium chloride (KLOR-CON) 10 MEQ tablet Take 1 tablet (10 mEq total) by mouth once a week. 17 tablet 2   rosuvastatin (CRESTOR) 20 MG tablet Take 1 tablet (20 mg total) by mouth daily. 90 tablet 3   Vitamin D, Ergocalciferol, 50 MCG (2000 UT) CAPS Take  2,000 Units by mouth daily.      vitamin E 45 MG (100 UNITS) capsule Take 100 Units by mouth every evening.     WHEAT GERM OIL PO Take 1 capsule by mouth in the morning and at bedtime.     No facility-administered medications prior to visit.   Review of Systems  Review of Systems  Constitutional:  Positive for fatigue.  HENT: Negative.    Respiratory: Negative.    Cardiovascular:  Positive for leg swelling.  Musculoskeletal:  Positive for arthralgias.  Psychiatric/Behavioral:  Positive for sleep disturbance.    Physical Exam  BP 118/70 (BP Location: Right Arm, Patient Position: Sitting, Cuff Size: Large)   Pulse 96   Temp 98.2 F (36.8 C) (Oral)   Ht '5\' 10"'$  (1.778 m)   Wt 210 lb 12.8 oz (95.6 kg)   SpO2 97%   BMI 30.25 kg/m  Physical Exam Constitutional:      Appearance: Normal appearance.  HENT:     Head: Normocephalic and atraumatic.     Mouth/Throat:     Mouth: Mucous membranes are moist.     Pharynx: Oropharynx is clear.  Cardiovascular:     Rate and Rhythm: Normal rate and regular rhythm.     Comments: + 2 pedal edema Pulmonary:     Effort: Pulmonary effort is normal.     Breath sounds: Normal breath sounds.  Musculoskeletal:        General: Normal range of motion.     Cervical back: Normal range of motion and neck supple.  Skin:    General: Skin is warm and dry.  Neurological:     General: No focal deficit present.     Mental Status: He is alert and oriented to person, place, and time. Mental status is at baseline.  Psychiatric:        Mood and Affect: Mood normal.        Behavior: Behavior normal.        Thought Content: Thought content normal.        Judgment: Judgment normal.      Lab Results:  CBC    Component Value Date/Time   WBC 9.0 09/10/2020 1117   RBC 5.35 09/10/2020 1117   HGB 16.2 09/10/2020 1117   HGB 14.3 12/28/2019 1558   HCT 47.9 09/10/2020 1117   HCT  42.8 12/28/2019 1558   PLT 213.0 09/10/2020 1117   PLT 272 12/28/2019 1558    MCV 89.7 09/10/2020 1117   MCV 91 12/28/2019 1558   MCH 30.2 12/28/2019 1558   MCHC 33.8 09/10/2020 1117   RDW 14.8 09/10/2020 1117   RDW 13.2 12/28/2019 1558   LYMPHSABS 1.8 09/10/2020 1117   MONOABS 0.8 09/10/2020 1117   EOSABS 0.1 09/10/2020 1117   BASOSABS 0.1 09/10/2020 1117    BMET    Component Value Date/Time   NA 140 08/13/2021 1000   K 4.4 08/13/2021 1000   CL 101 08/13/2021 1000   CO2 25 08/13/2021 1000   GLUCOSE 148 (H) 08/13/2021 1000   GLUCOSE 211 (H) 09/10/2020 1117   BUN 20 08/13/2021 1000   CREATININE 1.00 08/13/2021 1000   CALCIUM 9.4 08/13/2021 1000   GFRNONAA >60 12/26/2019 1303   GFRAA 51 (L) 12/20/2019 1526    BNP No results found for: "BNP"  ProBNP No results found for: "PROBNP"  Imaging: No results found.   Assessment & Plan:   Snoring - Patient has symptoms of restless/disruptive sleep, snoring and daytime sleepiness. Epworth score 5.  Patient is at risk for sleep apnea d/t BMI, needs home sleep study to evaluate.  Reviewed risks of untreated sleep apnea including cardiac arrhythmias, pulmonary hypertension, stroke and diabetes.  We discussed treatment options including weight loss, oral appliance, CPAP therapy or referral to ENT for possible surgical options.  If sleep study is negative, focus on sleep hygiene.  Encourage patient focus on side sleeping position or elevate head 30 degrees.  Advised against driving if experiencing excessive daytime sleepiness or fatigue.  Follow-up 1 to 2 weeks after sleep study review results and treatment options if needed.  Insomnia - Patient has difficulty maintaining sleep.  He was previously on trazodone for several years but weaned himself off due to side effects.  He does not want to be on medication for insomnia.  Advised patient aim to get between 6 and 8 hours of sleep at night, avoid caffeine and limit screen time prior to bedtime.    Ronald Ehrich, NP 11/11/2021

## 2021-11-11 NOTE — Assessment & Plan Note (Signed)
-   Patient has difficulty maintaining sleep.  He was previously on trazodone for several years but weaned himself off due to side effects.  He does not want to be on medication for insomnia.  Advised patient aim to get between 6 and 8 hours of sleep at night, avoid caffeine and limit screen time prior to bedtime.

## 2021-12-25 ENCOUNTER — Ambulatory Visit: Payer: Medicare HMO

## 2021-12-25 DIAGNOSIS — R0683 Snoring: Secondary | ICD-10-CM

## 2021-12-25 DIAGNOSIS — G478 Other sleep disorders: Secondary | ICD-10-CM

## 2021-12-28 ENCOUNTER — Encounter: Payer: Self-pay | Admitting: Primary Care

## 2022-01-15 ENCOUNTER — Ambulatory Visit: Payer: Medicare HMO

## 2022-01-15 DIAGNOSIS — G4733 Obstructive sleep apnea (adult) (pediatric): Secondary | ICD-10-CM | POA: Diagnosis not present

## 2022-01-21 DIAGNOSIS — G4733 Obstructive sleep apnea (adult) (pediatric): Secondary | ICD-10-CM | POA: Diagnosis not present

## 2022-01-29 ENCOUNTER — Telehealth: Payer: Self-pay | Admitting: Primary Care

## 2022-01-29 NOTE — Telephone Encounter (Signed)
Please let patient now HST 01/16/22 showed mild OSA, AHI 11/hour    Treatment options include weight loss, side sleeping position, oral appliance, CPAP therapy or referral to ENT for possible surgical options    If he is symptomatic and wants to be started on CPAP set him up with virtual visit. Otherwise we could refer to orthodontics for consideration for oral appliance

## 2022-02-01 NOTE — Telephone Encounter (Signed)
Called and spoke with pt letting him know the results of the HST and he verbalized understanding. Pt wanted a copy of HST results prior to deciding on if he wanted to have a referral to orthodontics or have a virtual visit scheduled to discuss CPAP so results have been placed up front for him. Nothing further needed.

## 2022-02-12 ENCOUNTER — Ambulatory Visit: Payer: Medicare HMO | Attending: Cardiology | Admitting: Cardiology

## 2022-02-12 ENCOUNTER — Ambulatory Visit: Payer: Medicare HMO | Admitting: Cardiology

## 2022-02-12 ENCOUNTER — Encounter: Payer: Self-pay | Admitting: Cardiology

## 2022-02-12 VITALS — BP 116/70 | HR 81 | Ht 70.0 in | Wt 210.8 lb

## 2022-02-12 DIAGNOSIS — E1100 Type 2 diabetes mellitus with hyperosmolarity without nonketotic hyperglycemic-hyperosmolar coma (NKHHC): Secondary | ICD-10-CM | POA: Diagnosis not present

## 2022-02-12 DIAGNOSIS — E782 Mixed hyperlipidemia: Secondary | ICD-10-CM | POA: Diagnosis not present

## 2022-02-12 DIAGNOSIS — I10 Essential (primary) hypertension: Secondary | ICD-10-CM | POA: Diagnosis not present

## 2022-02-12 DIAGNOSIS — I2510 Atherosclerotic heart disease of native coronary artery without angina pectoris: Secondary | ICD-10-CM | POA: Diagnosis not present

## 2022-02-12 MED ORDER — POTASSIUM CHLORIDE CRYS ER 20 MEQ PO TBCR: 10.0000 meq | EXTENDED_RELEASE_TABLET | Freq: Every day | ORAL | 3 refills | Status: DC

## 2022-02-12 MED ORDER — FUROSEMIDE 20 MG PO TABS: 10.0000 mg | ORAL_TABLET | Freq: Every day | ORAL | 3 refills | Status: DC

## 2022-02-12 NOTE — Progress Notes (Unsigned)
Cardiology Office Note:    Date:  02/15/2022   ID:  Ronald Cruz, DOB 10/17/1950, MRN [id number]  PCP:  Bernerd Limbo, MD  Cardiologist:  Berniece Salines, DO  Electrophysiologist:  None   Referring MD: Bernerd Limbo, MD   " I am doing okay"  History of Present Illness:    Ronald Cruz is a 71 y.o. male with a hx of  coronary artery disease, hypertension, hyperlipidemia, diabetes is here today for follow-up visit.  I last saw the patient on February 06, 2020 at that time he appeared to be doing well from a cardiovascular standpoint.  He had chest completed heart catheterization where he had mild to moderate nonobstructive coronary artery disease.  I did see the patient on August 18, 2020 at that time he was doing well from a cardiovascular standpoint had had some transient hypotension which we cut back on his Lasix to once a day.  Since have seen the patient was taking him off the diuretics.  At his visit on August 13, 2021 he had questions about calcium scoring.  I was able to answer the patient's question.  No medication changes were made at that time.  He had some edema but has been taken off the Lasix due to hypotension, we introduced his Lasix once weekly.  Since his visit he has stopped the lasix, he tells me that he was not able to control his bladder on the 20 mg dose of lasix. Meanwhile he has worsening leg swelling.   Past Medical History:  Diagnosis Date   Acute pain of left shoulder 03/30/2017   Allergic rhinitis 08/19/2012   ASCVD (arteriosclerotic cardiovascular disease) 10/24/2019   Cervical spondylosis 07/20/2010   Chronic midline low back pain without sciatica 08/28/2019   Last Assessment & Plan:  Formatting of this note might be different from the original. This pain is chronic, but he does not take medication, nor have any radicular symptoms.  Because he can have multiple diagnostic imaging studies performed on the same day for a single co-pay, he desires MRI of the lumbar spine  to be scheduled along with previously planned MRI of the brain.  MRI and plain films o   Contact dermatitis and eczema due to plant 06/28/2012   Formatting of this note might be different from the original. Overview:  IMPRESSION: Topical itch creams advised also.  Steroid will affect blood sugar of next few days. Formatting of this note might be different from the original. IMPRESSION: Topical itch creams advised also.  Steroid will affect blood sugar of next few days. Formatting of this note might be different from the original. 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 Overview:  IMPRESSION: Pt's Levitra has not worked. We will change to Viagra. Welbutrin usually does not affect erection. It could be more a result of his DM Formatting of this note might be different from the original. IMPRESSION: Pt's Levitra has not worked. We will change to Viagra. Welbutrin usually does not affect erection. It coul   Essential hypertension 08/19/2012   Herpes zoster 05/02/2013   Formatting of this note might be different from the original. Overview:  STORY: rash since sunday, post 72 hours window for antiviral therapy. `E1o3L`pain described is consistant with shingles, the rash is not vesicular, but has been scratching areas.`E1o3L`IMPRESSION: pain bearable. will watch and start neurontin if needed  Formatting of this note might be different from the original. STORY: rash    Hyperlipidemia 10/24/2019   Insomnia    Lump in neck 08/28/2019   Formatting of this note might be different from the original. RIGHT, just posterior to the angle of the mandible.  Nontender.  Soft and mobile.  Last Assessment & Plan:  Formatting of this note might be different from the original. Right, just posterior to the angle of the mandible.  Nontender.  Soft and mobile.  This is not concerning for neoplasm.    Memory changes 08/21/2019   Mixed hyperlipidemia 05/02/2013   Formatting of this note might be different from the original. Overview:  IMPRESSION: The goal is to have your total cholesterol < 200, the HDL (good cholesterol) >40, and the LDL (bad cholesterol) <100.`E1o3L`It is recomended that you follow a good low fat diet and exercise for 30 minutes 3-4 times a week. Formatting of this note might be different from the original. 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 Overview:  IMPRESSION: The goal is for the Hgb A1C to be less than 7.0.`E1o3L`It is recommended that all diatetics are educated on and follow a healthy diabetic diet, exercise for 30 minutes 3-4 times per week (walking, biking, swimming, or machine), monitor blood glucose readings and bring that record with you to be reviewed at your ne   Vitamin D deficiency 08/18/2011   Formatting of this note might be different from the original. Overview:  IMPRESSION: patient taking Vitamin D 2000IU per day. His levels are normal. Continue same dose Formatting of this note might be different from the original. IMPRESSION: patient taking Vitamin D 2000IU per day. His levels are normal. Continue same dose Formatting of this note might be different from the original. Overview:  Ov    Past Surgical History:  Procedure Laterality  Date   HAND SURGERY Right    Thumb   HERNIA REPAIR     LEFT HEART CATH AND CORONARY ANGIOGRAPHY N/A 01/04/2020   Procedure: LEFT HEART CATH AND CORONARY ANGIOGRAPHY;  Surgeon: Burnell Blanks, MD;  Location: [facility];  Service: Cardiovascular;  Laterality: N/A;    Current Medications: Current Meds  Medication Sig   amLODipine (NORVASC) 10 MG tablet amlodipine 10 mg tablet  TAKE 1 TABLET BY MOUTH ONCE DAILY   Ascorbic Acid (VITAMIN C) 1000 MG tablet Take 1,000 mg by mouth every evening.    aspirin EC 81 MG tablet Take 81 mg by mouth daily. Swallow whole.   benazepril (LOTENSIN) 20 MG tablet Take 20 mg by mouth daily.   cyanocobalamin 1000 MCG tablet Take 1,000 mcg by mouth in the morning and at bedtime.    Empagliflozin-linaGLIPtin (GLYXAMBI) 25-5 MG TABS Take 1 tablet by mouth daily.   furosemide (LASIX) 20 MG tablet Take 0.5 tablets (10 mg total)  by mouth daily.   glipiZIDE (GLUCOTROL XL) 10 MG 24 hr tablet Take 10 mg by mouth in the morning and at bedtime.   ibuprofen (ADVIL) 200 MG tablet Take 200 mg by mouth as needed.   metFORMIN (GLUCOPHAGE) 1000 MG tablet Take 1,000 mg by mouth 2 (two) times daily with a meal.   naproxen (NAPROSYN) 500 MG tablet Take 500 mg by mouth 2 (two) times daily.   omeprazole (PRILOSEC) 20 MG capsule Take 1 capsule (20 mg total) by mouth daily.   potassium chloride SA (KLOR-CON M) 20 MEQ tablet Take 0.5 tablets (10 mEq total) by mouth daily.   rosuvastatin (CRESTOR) 20 MG tablet Take 1 tablet (20 mg total) by mouth daily.   Vitamin D, Ergocalciferol, 50 MCG (2000 UT) CAPS Take 2,000 Units by mouth daily.    vitamin E 45 MG (100 UNITS) capsule Take 100 Units by mouth every evening.   WHEAT GERM OIL PO Take 3 capsules by mouth in the morning and at bedtime. 3 capsules in the morning and 3 capsules in the evening.     Allergies:   Patient has no known allergies.   Social History   Socioeconomic History   Marital status: Married     Spouse name: Not on file   Number of children: Not on file   Years of education: Not on file   Highest education level: Not on file  Occupational History   Not on file  Tobacco Use   Smoking status: Never    Passive exposure: Past   Smokeless tobacco: Never  Substance and Sexual Activity   Alcohol use: Yes    Comment: beer once or twice a month   Drug use: Never   Sexual activity: Not on file  Other Topics Concern   Not on file  Social History Narrative   Not on file   Social Determinants of Health   Financial Resource Strain: Not on file  Food Insecurity: Not on file  Transportation Needs: Not on file  Physical Activity: Not on file  Stress: Not on file  Social Connections: Not on file     Family History: The patient's family history includes Cancer in his paternal grandfather; Diabetes in his father, maternal uncle, maternal uncle, paternal aunt, and paternal uncle; Diverticulosis in his father, sister, and sister; Esophageal cancer in his mother; Irritable bowel syndrome in his father and sister; Pancreatic cancer in his maternal grandfather; Stroke in his father. There is no history of Colon cancer, Stomach cancer, or Rectal cancer.  ROS:   Review of Systems  Constitution: Negative for decreased appetite, fever and weight gain.  HENT: Negative for congestion, ear discharge, hoarse voice and sore throat.   Eyes: Negative for discharge, redness, vision loss in right eye and visual halos.  Cardiovascular: Negative for chest pain, dyspnea on exertion, leg swelling, orthopnea and palpitations.  Respiratory: Negative for cough, hemoptysis, shortness of breath and snoring.   Endocrine: Negative for heat intolerance and polyphagia.  Hematologic/Lymphatic: Negative for bleeding problem. Does not bruise/bleed easily.  Skin: Negative for flushing, nail changes, rash and suspicious lesions.  Musculoskeletal: Negative for arthritis, joint pain, muscle cramps, myalgias, neck pain and  stiffness.  Gastrointestinal: Negative for abdominal pain, bowel incontinence, diarrhea and excessive appetite.  Genitourinary: Negative for decreased libido, genital sores and incomplete emptying.  Neurological: Negative for brief paralysis, focal weakness, headaches and loss of balance.  Psychiatric/Behavioral: Negative for altered mental status, depression and suicidal ideas.  Allergic/Immunologic: Negative for HIV  exposure and persistent infections.    EKGs/Labs/Other Studies Reviewed:    The following studies were reviewed today:   EKG:  None today   January 04, 2020 Left catheterizationProx RCA lesion is 30% stenosed. Ost LM to Mid LM lesion is 20% stenosed. Ost Cx to Prox Cx lesion is 20% stenosed. Dist LM to Prox LAD lesion is 30% stenosed. LPDA lesion is 70% stenosed.   1. Mild distal left main stenosis. This lesion is calcified 2. Mild ostial LAD stenosis. This lesion is calcified 3. Mild ostial Circumflex stenosis. This lesion is calcified. The most distal small obtuse marginal branch has a moderate, eccentric stenosis.  4. The RCA is a small to moderate caliber non-dominant artery with mild plaque in the proximal and mid segments.    Recommendations: Medical management of mild non-obstructive CAD.     Transthoracic echocardiogram 11/27/2019 IMPRESSIONS   1. Left ventricular ejection fraction, by estimation, is 55 to 60%. The  left ventricle has normal function. The left ventricle has no regional  wall motion abnormalities. Left ventricular diastolic parameters are  consistent with Grade I diastolic  dysfunction (impaired relaxation).   2. Right ventricular systolic function is normal. The right ventricular  size is normal. Tricuspid regurgitation signal is inadequate for assessing  PA pressure.   3. The mitral valve is grossly normal. Trivial mitral valve  regurgitation. No evidence of mitral stenosis.   4. The aortic valve is tricuspid. Aortic valve regurgitation  is not  visualized. No aortic stenosis is present.   5. The inferior vena cava is normal in size with greater than 50%  respiratory variability, suggesting right atrial pressure of 3 mmHg.   Conclusion(s)/Recommendation(s): Normal biventricular function without  evidence of hemodynamically significant valvular heart disease.   FINDINGS   Left Ventricle: Left ventricular ejection fraction, by estimation, is 55  to 60%. The left ventricle has normal function. The left ventricle has no  regional wall motion abnormalities. Definity contrast agent was given IV  to delineate the left ventricular   endocardial borders. The left ventricular internal cavity size was normal  in size. There is no left ventricular hypertrophy. Left ventricular  diastolic parameters are consistent with Grade I diastolic dysfunction  (impaired relaxation).   Right Ventricle: The right ventricular size is normal. No increase in  right ventricular wall thickness. Right ventricular systolic function is  normal. Tricuspid regurgitation signal is inadequate for assessing PA  pressure.   Left Atrium: Left atrial size was normal in size.   Right Atrium: Right atrial size was normal in size.   Pericardium: Trivial pericardial effusion is present.   Mitral Valve: The mitral valve is grossly normal. Trivial mitral valve  regurgitation. No evidence of mitral valve stenosis.   Tricuspid Valve: The tricuspid valve is grossly normal. Tricuspid valve  regurgitation is not demonstrated. No evidence of tricuspid stenosis.   Aortic Valve: The aortic valve is tricuspid. Aortic valve regurgitation is  not visualized. No aortic stenosis is present.   Pulmonic Valve: The pulmonic valve was grossly normal. Pulmonic valve  regurgitation is trivial. No evidence of pulmonic stenosis.   Aorta: The aortic root and ascending aorta are structurally normal, with  no evidence of dilitation.   Venous: The inferior vena cava is normal  in size with greater than 50%  respiratory variability, suggesting right atrial pressure of 3 mmHg.   IAS/Shunts: The atrial septum is grossly normal.     Recent Labs: 08/13/2021: BUN 20; Creatinine, Ser 1.00; Magnesium 1.9;  Potassium 4.4; Sodium 140  Recent Lipid Panel No results found for: "CHOL", "TRIG", "HDL", "CHOLHDL", "VLDL", "LDLCALC", "LDLDIRECT"  Physical Exam:    VS:  BP 116/70   Pulse 81   Ht '5\' 10"'$  (1.778 m)   Wt 210 lb 12.8 oz (95.6 kg)   SpO2 95%   BMI 30.25 kg/m     Wt Readings from Last 3 Encounters:  02/12/22 210 lb 12.8 oz (95.6 kg)  11/11/21 210 lb 12.8 oz (95.6 kg)  08/13/21 210 lb (95.3 kg)     GEN: Well nourished, well developed in no acute distress HEENT: Normal NECK: No JVD; No carotid bruits LYMPHATICS: No lymphadenopathy CARDIAC: S1S2 noted,RRR, no murmurs, rubs, gallops RESPIRATORY:  Clear to auscultation without rales, wheezing or rhonchi  ABDOMEN: Soft, non-tender, non-distended, +bowel sounds, no guarding. EXTREMITIES: +2 bilateral lower extremity edema, No cyanosis, no clubbing MUSCULOSKELETAL:  No deformity  SKIN: Warm and dry NEUROLOGIC:  Alert and oriented x 3, non-focal PSYCHIATRIC:  Normal affect, good insight  ASSESSMENT:    1. Essential hypertension   2. Coronary artery disease involving native coronary artery of native heart without angina pectoris   3. Primary hypertension   4. Type 2 diabetes mellitus with hyperosmolarity without coma, without long-term current use of insulin ([place])   5. Mixed hyperlipidemia     PLAN:     CAD-no angina symptoms.  We will continue the patient on his current aspirin and statin dose.  He had questions about any repeat testing.  I explained to the patient that there will be no change in management plan.  He is not having any angina symptoms at this time.  And if he develops angina we will start antianginals and likely do functional testing like a nuclear stress test to assess for ischemia.  He  is currently at edema which is significant.  He stopped taking his Lasix. We discussed he will resume the Lasix at a half a pill of his 20 mg dose.   Hyperlipidemia - continue with current statin medication.   The patient is in agreement with the above plan. The patient left the office in stable condition.  The patient will follow up in 6 months.    Medication Adjustments/Labs and Tests Ordered: Current medicines are reviewed at length with the patient today.  Concerns regarding medicines are outlined above.  No orders of the defined types were placed in this encounter.  Meds ordered this encounter  Medications   furosemide (LASIX) 20 MG tablet    Sig: Take 0.5 tablets (10 mg total) by mouth daily.    Dispense:  45 tablet    Refill:  3   potassium chloride SA (KLOR-CON M) 20 MEQ tablet    Sig: Take 0.5 tablets (10 mEq total) by mouth daily.    Dispense:  45 tablet    Refill:  3    Patient Instructions  Medication Instructions:  Your physician has recommended you make the following change in your medication: RESTART: Lasix '20mg'$  daily ( half a tablet)  RESTART: Potassium 34mq daily (half a tablet)  *If you need a refill on your cardiac medications before your next appointment, please call your pharmacy*   Lab Work: NONE If you have labs (blood work) drawn today and your tests are completely normal, you will receive your results only by: [place](if you have MyChart) OR A paper copy in the mail If you have any lab test that is abnormal or we need to change your  treatment, we will call you to review the results.   Testing/Procedures: NONE   Follow-Up: At [facility], you and your health needs are our priority.  As part of our continuing mission to provide you with exceptional heart care, we have created designated Provider Care Teams.  These Care Teams include your primary Cardiologist (physician) and Advanced Practice Providers (APPs -  Physician  Assistants and Nurse Practitioners) who all work together to provide you with the care you need, when you need it.  We recommend signing up for the patient portal called "MyChart".  Sign up information is provided on this After Visit Summary.  MyChart is used to connect with patients for Virtual Visits (Telemedicine).  Patients are able to view lab/test results, encounter notes, upcoming appointments, etc.  Non-urgent messages can be sent to your provider as well.   To learn more about what you can do with MyChart, go to [email or web address].    Your next appointment:   6 month(s)  The format for your next appointment:   In Person  Provider:   Berniece Salines, DO     Adopting a Healthy Lifestyle.  Know what a healthy weight is for you (roughly BMI <25) and aim to maintain this   Aim for 7+ servings of fruits and vegetables daily   65-80+ fluid ounces of water or unsweet tea for healthy kidneys   Limit to max 1 drink of alcohol per day; avoid smoking/tobacco   Limit animal fats in diet for cholesterol and heart health - choose grass fed whenever available   Avoid highly processed foods, and foods high in saturated/trans fats   Aim for low stress - take time to unwind and care for your mental health   Aim for 150 min of moderate intensity exercise weekly for heart health, and weights twice weekly for bone health   Aim for 7-9 hours of sleep daily   When it comes to diets, agreement about the perfect plan isnt easy to find, even among the experts. Experts at the [employer] developed an idea known as the Healthy Eating Plate. Just imagine a plate divided into logical, healthy portions.   The emphasis is on diet quality:   Load up on vegetables and fruits - one-half of your plate: Aim for color and variety, and remember that potatoes dont count.   Go for whole grains - one-quarter of your plate: Whole wheat, barley, wheat berries, quinoa, oats, brown rice,  and foods made with them. If you want pasta, go with whole wheat pasta.   Protein power - one-quarter of your plate: Fish, chicken, beans, and nuts are all healthy, versatile protein sources. Limit red meat.   The diet, however, does go beyond the plate, offering a few other suggestions.   Use healthy plant oils, such as olive, canola, soy, corn, sunflower and peanut. Check the labels, and avoid partially hydrogenated oil, which have unhealthy trans fats.   If youre thirsty, drink water. Coffee and tea are good in moderation, but skip sugary drinks and limit milk and dairy products to one or two daily servings.   The type of carbohydrate in the diet is more important than the amount. Some sources of carbohydrates, such as vegetables, fruits, whole grains, and beans-are healthier than others.   Finally, stay active  Signed, Berniece Salines, DO  02/15/2022 7:59 AM    [place]

## 2022-02-12 NOTE — Patient Instructions (Signed)
Medication Instructions:  Your physician has recommended you make the following change in your medication: RESTART: Lasix '20mg'$  daily ( half a tablet)  RESTART: Potassium 60mq daily (half a tablet)  *If you need a refill on your cardiac medications before your next appointment, please call your pharmacy*   Lab Work: NONE If you have labs (blood work) drawn today and your tests are completely normal, you will receive your results only by: [place](if you have MyChart) OR A paper copy in the mail If you have any lab test that is abnormal or we need to change your treatment, we will call you to review the results.   Testing/Procedures: NONE   Follow-Up: [facility] and [facility] are our [facility].  As part of our continuing mission to provide you with exceptional heart care, we have created designated Provider Care Teams.  These Care Teams include your primary Cardiologist (physician) and Advanced Practice Providers (APPs -  Physician Assistants and Nurse Practitioners) who all work together to provide you with the care you need, when you need it.  We recommend signing up for the patient portal called "MyChart".  Sign up information is provided on this After Visit Summary.  MyChart is used to connect with patients for Virtual Visits (Telemedicine).  Patients are able to view lab/test results, encounter notes, upcoming appointments, etc.  Non-urgent messages can be sent to your provider as well.   To learn more about what you can do with MyChart, go to [email or web address]    Your next appointment:   6 month(s)  The format for your next appointment:   In Person  Provider:   KBerniece Salines DO

## 2022-07-28 ENCOUNTER — Encounter: Payer: Self-pay | Admitting: Cardiology

## 2022-07-28 ENCOUNTER — Ambulatory Visit: Payer: Medicare HMO | Attending: Cardiology | Admitting: Cardiology

## 2022-07-28 VITALS — BP 124/71 | HR 92 | Ht 70.0 in | Wt 200.5 lb

## 2022-07-28 DIAGNOSIS — I10 Essential (primary) hypertension: Secondary | ICD-10-CM

## 2022-07-28 DIAGNOSIS — E782 Mixed hyperlipidemia: Secondary | ICD-10-CM

## 2022-07-28 DIAGNOSIS — Z7984 Long term (current) use of oral hypoglycemic drugs: Secondary | ICD-10-CM

## 2022-07-28 DIAGNOSIS — E1100 Type 2 diabetes mellitus with hyperosmolarity without nonketotic hyperglycemic-hyperosmolar coma (NKHHC): Secondary | ICD-10-CM | POA: Diagnosis not present

## 2022-07-28 DIAGNOSIS — I2510 Atherosclerotic heart disease of native coronary artery without angina pectoris: Secondary | ICD-10-CM | POA: Diagnosis not present

## 2022-07-28 MED ORDER — FUROSEMIDE 20 MG PO TABS: 20.0000 mg | ORAL_TABLET | ORAL | 3 refills | Status: DC

## 2022-07-28 MED ORDER — POTASSIUM CHLORIDE ER 10 MEQ PO TBCR: 10.0000 meq | EXTENDED_RELEASE_TABLET | ORAL | 3 refills | Status: AC

## 2022-07-28 NOTE — Patient Instructions (Signed)
Medication Instructions:  Your physician has recommended you make the following change in your medication:  START: Furosemide (Lasix) 20 mg every other day START: Potassium 10 mEq every other day *If you need a refill on your cardiac medications before your next appointment, please call your pharmacy*   Lab Work: None   Testing/Procedures: None   Follow-Up: At [facility], you and your health needs are our priority.  As part of our continuing mission to provide you with exceptional heart care, we have created designated Provider Care Teams.  These Care Teams include your primary Cardiologist (physician) and Advanced Practice Providers (APPs -  Physician Assistants and Nurse Practitioners) who all work together to provide you with the care you need, when you need it.  Your next appointment:   6 month(s)  Provider:   Thomasene Ripple, DO

## 2022-07-28 NOTE — Progress Notes (Signed)
Cardiology Office Note:    Date:  08/01/2022   ID:  Ronald Cruz, DOB Apr 12, 1950, MRN [id number]  PCP:  Tracey Harries, MD  Cardiologist:  Thomasene Ripple, DO  Electrophysiologist:  None   Referring MD: Tracey Harries, MD   " I am doing okay"  History of Present Illness:    Ronald Cruz is a 72 y.o. male with a hx of  coronary artery disease, hypertension, hyperlipidemia, diabetes is here today for follow-up visit.  I last saw the patient on February 06, 2020 at that time he appeared to be doing well from a cardiovascular standpoint.  He had chest completed heart catheterization where he had mild to moderate nonobstructive coronary artery disease.  I did see the patient on August 18, 2020 at that time he was doing well from a cardiovascular standpoint had had some transient hypotension which we cut back on his Lasix to once a day.  Since have seen the patient was taking him off the diuretics.  At his visit on August 13, 2021 he had questions about calcium scoring.  I was able to answer the patient's question.  No medication changes were made at that time.  He had some edema but has been taken off the Lasix due to hypotension, we introduced his Lasix once weekly.  At his last visit in December 2023 he was not able to tolerate Lasix due to frequent urination.  He still had leg swelling at that time as well also.  We discussed that he will cut his Lasix in half given the fact that he has been having trouble with this.  Today he tells me that he is having more difficulty with trying to split the Lasix in half and once also discussed that.  I shared with the patient that it could be beneficial to use his Lasix every other day.  He also mention that there was " an elephant in the room referring to his calcium scoring."  During his visit I explained to the patient again noting that there is no clinical benefit to repeat coronary calcium scoring.  We already have had this testing including coronary CTA and a  heart catheterization with nonobstructive CAD CAD.  He does not have any anginal symptoms at all.  I explained to him that there is no indication for pursuing any ischemic evaluation with no symptoms suggesting angina or anginal equivalent.  I tried to clarify why coronary calcification is not the test of choice for any repeat evaluation for the patient given his coronary artery disease and given previous testing he is done and knowing at this point that he is asymptomatic.   Past Medical History:  Diagnosis Date   Acute pain of left shoulder 03/30/2017   Allergic rhinitis 08/19/2012   ASCVD (arteriosclerotic cardiovascular disease) 10/24/2019   Cervical spondylosis 07/20/2010   Chronic midline low back pain without sciatica 08/28/2019   Last Assessment & Plan:  Formatting of this note might be different from the original. This pain is chronic, but he does not take medication, nor have any radicular symptoms.  Because he can have multiple diagnostic imaging studies performed on the same day for a single co-pay, he desires MRI of the lumbar spine to be scheduled along with previously planned MRI of the brain.  MRI and plain films o   Contact dermatitis and eczema due to plant 06/28/2012   Formatting of this note might be different from the original. Overview:  IMPRESSION: Topical itch creams advised also.  Steroid will affect blood sugar of next few days. Formatting of this note might be different from the original. IMPRESSION: Topical itch creams advised also.  Steroid will affect blood sugar of next few days. Formatting of this note might be different from the original. 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 Overview:  IMPRESSION: Pt's Levitra has not worked. We will change to Viagra. Welbutrin usually does not affect erection. It could be more a result of his DM  Formatting of this note might be different from the original. IMPRESSION: Pt's Levitra has not worked. We will change to Viagra. Welbutrin usually does not affect erection. It coul   Essential hypertension 08/19/2012   Herpes zoster 05/02/2013   Formatting of this note might be different from the original. Overview:  STORY: rash since sunday, post 72 hours window for antiviral therapy. `E1o3L`pain described is consistant with shingles, the rash is not vesicular, but has been scratching areas.`E1o3L`IMPRESSION: pain bearable. will watch and start neurontin if needed Formatting of this note might be different from the original. STORY: rash    Hyperlipidemia 10/24/2019   Insomnia    Lump in neck 08/28/2019   Formatting of this note might be different from the original. RIGHT, just posterior to the angle of the mandible.  Nontender.  Soft and mobile.  Last Assessment & Plan:  Formatting of this note might be different from the original. Right, just posterior to the angle of the mandible.  Nontender.  Soft and mobile.  This is not concerning for neoplasm.   Memory changes 08/21/2019   Mixed hyperlipidemia 05/02/2013   Formatting of this note might be different from the original. Overview:  IMPRESSION: The goal is to have your total cholesterol < 200, the HDL (good cholesterol) >40, and the LDL (bad cholesterol) <100.`E1o3L`It is recomended that you follow a good low fat diet and exercise for 30 minutes 3-4 times a week. Formatting of this note might be different from the original. 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 Overview:  IMPRESSION: The goal is for the Hgb A1C to be less than 7.0.`E1o3L`It is recommended that all diatetics are educated on and follow a healthy diabetic diet, exercise for 30 minutes 3-4 times per week (walking, biking, swimming, or machine), monitor blood glucose readings and bring that record with you to be reviewed at your ne   Vitamin D deficiency 08/18/2011   Formatting of this note might be different from the original. Overview:  IMPRESSION: patient taking Vitamin D 2000IU per day. His levels are normal. Continue same dose Formatting of this note might be different from the original. IMPRESSION: patient taking Vitamin D 2000IU per day. His levels are normal. Continue same  dose Formatting of this note might be different from the original. Overview:  Ov    Past Surgical History:  Procedure Laterality Date   HAND SURGERY Right    Thumb   HERNIA REPAIR     LEFT HEART CATH AND CORONARY ANGIOGRAPHY N/A 01/04/2020   Procedure: LEFT HEART CATH AND CORONARY ANGIOGRAPHY;  Surgeon: Kathleene Hazel, MD;  Location: [facility];  Service: Cardiovascular;  Laterality: N/A;    Current Medications: Current Meds  Medication Sig   amLODipine (NORVASC) 10 MG tablet amlodipine 10 mg tablet  TAKE 1 TABLET BY MOUTH ONCE DAILY   Ascorbic Acid (VITAMIN C) 1000 MG tablet Take 1,000 mg by mouth every evening.    aspirin EC 81 MG tablet Take 81 mg by mouth daily. Swallow whole.   benazepril (LOTENSIN) 20 MG tablet Take 20 mg by mouth daily.   cyanocobalamin 1000 MCG tablet Take 1,000 mcg by mouth in the morning and at bedtime.    furosemide (LASIX) 20 MG tablet Take 1 tablet (20 mg total) by mouth every other day.   glipiZIDE (GLUCOTROL XL) 10 MG 24 hr  tablet Take 10 mg by mouth in the morning and at bedtime.   ibuprofen (ADVIL) 200 MG tablet Take 200 mg by mouth as needed.   metFORMIN (GLUCOPHAGE) 1000 MG tablet Take 1,000 mg by mouth 2 (two) times daily with a meal.   MOUNJARO 2.5 MG/0.5ML Pen Inject 2.5 mg into the skin once a week.   naproxen (NAPROSYN) 500 MG tablet Take 500 mg by mouth 2 (two) times daily.   omeprazole (PRILOSEC) 20 MG capsule Take 1 capsule (20 mg total) by mouth daily.   potassium chloride (KLOR-CON) 10 MEQ tablet Take 1 tablet (10 mEq total) by mouth every other day.   rosuvastatin (CRESTOR) 20 MG tablet Take 1 tablet (20 mg total) by mouth daily.   Vitamin D, Ergocalciferol, 50 MCG (2000 UT) CAPS Take 2,000 Units by mouth daily.    vitamin E 45 MG (100 UNITS) capsule Take 100 Units by mouth every evening.   WHEAT GERM OIL PO Take 3 capsules by mouth in the morning and at bedtime. 3 capsules in the morning and 3 capsules in the evening.   [DISCONTINUED] furosemide (LASIX) 20 MG tablet Take 0.5 tablets (10 mg total) by mouth daily.   [DISCONTINUED] potassium chloride SA (KLOR-CON M) 20 MEQ tablet Take 0.5 tablets (10 mEq total) by mouth daily.     Allergies:   Patient has no known allergies.   Social History   Socioeconomic History   Marital status: Married    Spouse name: Not on file   Number of children: Not on file   Years of education: Not on file   Highest education level: Not on file  Occupational History   Not on file  Tobacco Use   Smoking status: Never    Passive exposure: Past   Smokeless tobacco: Never  Substance and Sexual Activity   Alcohol use: Yes    Comment: beer once or twice a month   Drug use: Never   Sexual activity: Not on file  Other Topics Concern   Not on file  Social History Narrative   Not on file   Social Determinants of Health   Financial Resource Strain: Not on file  Food Insecurity: Not on file  Transportation Needs: Not on file  Physical Activity: Not on file   Stress: Not on file  Social Connections: Not on file  Family History: The patient's family history includes Cancer in his paternal grandfather; Diabetes in his father, maternal uncle, maternal uncle, paternal aunt, and paternal uncle; Diverticulosis in his father, sister, and sister; Esophageal cancer in his mother; Irritable bowel syndrome in his father and sister; Pancreatic cancer in his maternal grandfather; Stroke in his father. There is no history of Colon cancer, Stomach cancer, or Rectal cancer.  ROS:   Review of Systems  Constitution: Negative for decreased appetite, fever and weight gain.  HENT: Negative for congestion, ear discharge, hoarse voice and sore throat.   Eyes: Negative for discharge, redness, vision loss in right eye and visual halos.  Cardiovascular: Negative for chest pain, dyspnea on exertion, leg swelling, orthopnea and palpitations.  Respiratory: Negative for cough, hemoptysis, shortness of breath and snoring.   Endocrine: Negative for heat intolerance and polyphagia.  Hematologic/Lymphatic: Negative for bleeding problem. Does not bruise/bleed easily.  Skin: Negative for flushing, nail changes, rash and suspicious lesions.  Musculoskeletal: Negative for arthritis, joint pain, muscle cramps, myalgias, neck pain and stiffness.  Gastrointestinal: Negative for abdominal pain, bowel incontinence, diarrhea and excessive appetite.  Genitourinary: Negative for decreased libido, genital sores and incomplete emptying.  Neurological: Negative for brief paralysis, focal weakness, headaches and loss of balance.  Psychiatric/Behavioral: Negative for altered mental status, depression and suicidal ideas.  Allergic/Immunologic: Negative for HIV exposure and persistent infections.    EKGs/Labs/Other Studies Reviewed:    The following studies were reviewed today:   EKG:  None today   January 04, 2020 Left catheterizationProx RCA lesion is 30% stenosed. Ost LM to Mid LM  lesion is 20% stenosed. Ost Cx to Prox Cx lesion is 20% stenosed. Dist LM to Prox LAD lesion is 30% stenosed. LPDA lesion is 70% stenosed.   1. Mild distal left main stenosis. This lesion is calcified 2. Mild ostial LAD stenosis. This lesion is calcified 3. Mild ostial Circumflex stenosis. This lesion is calcified. The most distal small obtuse marginal branch has a moderate, eccentric stenosis.  4. The RCA is a small to moderate caliber non-dominant artery with mild plaque in the proximal and mid segments.    Recommendations: Medical management of mild non-obstructive CAD.     Transthoracic echocardiogram 11/27/2019 IMPRESSIONS   1. Left ventricular ejection fraction, by estimation, is 55 to 60%. The  left ventricle has normal function. The left ventricle has no regional  wall motion abnormalities. Left ventricular diastolic parameters are  consistent with Grade I diastolic  dysfunction (impaired relaxation).   2. Right ventricular systolic function is normal. The right ventricular  size is normal. Tricuspid regurgitation signal is inadequate for assessing  PA pressure.   3. The mitral valve is grossly normal. Trivial mitral valve  regurgitation. No evidence of mitral stenosis.   4. The aortic valve is tricuspid. Aortic valve regurgitation is not  visualized. No aortic stenosis is present.   5. The inferior vena cava is normal in size with greater than 50%  respiratory variability, suggesting right atrial pressure of 3 mmHg.   Conclusion(s)/Recommendation(s): Normal biventricular function without  evidence of hemodynamically significant valvular heart disease.   FINDINGS   Left Ventricle: Left ventricular ejection fraction, by estimation, is 55  to 60%. The left ventricle has normal function. The left ventricle has no  regional wall motion abnormalities. Definity contrast agent was given IV  to delineate the left ventricular   endocardial borders. The left ventricular internal  cavity size was normal  in size. There is no left ventricular  hypertrophy. Left ventricular  diastolic parameters are consistent with Grade I diastolic dysfunction  (impaired relaxation).   Right Ventricle: The right ventricular size is normal. No increase in  right ventricular wall thickness. Right ventricular systolic function is  normal. Tricuspid regurgitation signal is inadequate for assessing PA  pressure.   Left Atrium: Left atrial size was normal in size.   Right Atrium: Right atrial size was normal in size.   Pericardium: Trivial pericardial effusion is present.   Mitral Valve: The mitral valve is grossly normal. Trivial mitral valve  regurgitation. No evidence of mitral valve stenosis.   Tricuspid Valve: The tricuspid valve is grossly normal. Tricuspid valve  regurgitation is not demonstrated. No evidence of tricuspid stenosis.   Aortic Valve: The aortic valve is tricuspid. Aortic valve regurgitation is  not visualized. No aortic stenosis is present.   Pulmonic Valve: The pulmonic valve was grossly normal. Pulmonic valve  regurgitation is trivial. No evidence of pulmonic stenosis.   Aorta: The aortic root and ascending aorta are structurally normal, with  no evidence of dilitation.   Venous: The inferior vena cava is normal in size with greater than 50%  respiratory variability, suggesting right atrial pressure of 3 mmHg.   IAS/Shunts: The atrial septum is grossly normal.     Recent Labs: 08/13/2021: BUN 20; Creatinine, Ser 1.00; Magnesium 1.9; Potassium 4.4; Sodium 140  Recent Lipid Panel No results found for: "CHOL", "TRIG", "HDL", "CHOLHDL", "VLDL", "LDLCALC", "LDLDIRECT"  Physical Exam:    VS:  BP 124/71   Pulse 92   Ht 5\' 10"  (1.778 m)   Wt 200 lb 8 oz (90.9 kg)   SpO2 92%   BMI 28.77 kg/m     Wt Readings from Last 3 Encounters:  07/28/22 200 lb 8 oz (90.9 kg)  02/12/22 210 lb 12.8 oz (95.6 kg)  11/11/21 210 lb 12.8 oz (95.6 kg)     GEN: Well  nourished, well developed in no acute distress HEENT: Normal NECK: No JVD; No carotid bruits LYMPHATICS: No lymphadenopathy CARDIAC: S1S2 noted,RRR, no murmurs, rubs, gallops RESPIRATORY:  Clear to auscultation without rales, wheezing or rhonchi  ABDOMEN: Soft, non-tender, non-distended, +bowel sounds, no guarding. EXTREMITIES: +2 bilateral lower extremity edema, No cyanosis, no clubbing MUSCULOSKELETAL:  No deformity  SKIN: Warm and dry NEUROLOGIC:  Alert and oriented x 3, non-focal PSYCHIATRIC:  Normal affect, good insight  ASSESSMENT:    1. Essential hypertension   2. Type 2 diabetes mellitus with hyperosmolarity without coma, without long-term current use of insulin (HCC)   3. Mixed hyperlipidemia   4. Coronary artery disease involving native coronary artery of native heart without angina pectoris     PLAN:    Coronary artery disease- During his visit I explained to the patient again noting that there is no clinical benefit to repeat coronary calcium scoring.  We already have had this testing including coronary CTA and a heart catheterization with nonobstructive CAD.  He does not have any anginal symptoms at all.  I explained to him that there is no indication for pursuing any ischemic evaluation with no symptoms suggesting angina or anginal equivalent.  I tried to clarify why coronary calcification is not the test of choice for any repeat evaluation for the patient given his coronary artery disease and given previous testing he is done and knowing at this point that he is asymptomatic.  So during this visit he is asymptomatic no concern for anginal or anginal equivalent.  He currently is on aspirin  and statin we will continue this at this time.  As noted above I explained to the patient that there is no indication for ischemic evaluation at this time.  Also shared with him that if he experience symptoms that could suggest angina we will start him on antianginal in pursuing with  functional ischemic evaluation.  He is currently at edema which is significant.  He stopped taking his Lasix. We discussed he will resume the Lasix at a half a pill of his 20 mg dose.  Hyperlipidemia - continue with current statin medication. He recently had blood work with his PCP.  Would like that information he has committed to sharing this information with me.  LDL goal less than 55.  Diabetes mellitus type managed by his PCP.  The patient is in agreement with the above plan. The patient left the office in stable condition.  The patient will follow up in 6 months.    Medication Adjustments/Labs and Tests Ordered: Current medicines are reviewed at length with the patient today.  Concerns regarding medicines are outlined above.  No orders of the defined types were placed in this encounter.  Meds ordered this encounter  Medications   furosemide (LASIX) 20 MG tablet    Sig: Take 1 tablet (20 mg total) by mouth every other day.    Dispense:  45 tablet    Refill:  3   potassium chloride (KLOR-CON) 10 MEQ tablet    Sig: Take 1 tablet (10 mEq total) by mouth every other day.    Dispense:  45 tablet    Refill:  3    Patient Instructions  Medication Instructions:  Your physician has recommended you make the following change in your medication:  START: Furosemide (Lasix) 20 mg every other day START: Potassium 10 mEq every other day *If you need a refill on your cardiac medications before your next appointment, please call your pharmacy*   Lab Work: None   Testing/Procedures: None   Follow-Up: At [facility], you and your health needs are our priority.  As part of our continuing mission to provide you with exceptional heart care, we have created designated Provider Care Teams.  These Care Teams include your primary Cardiologist (physician) and Advanced Practice Providers (APPs -  Physician Assistants and Nurse Practitioners) who all work together to provide you with the  care you need, when you need it.  Your next appointment:   6 month(s)  Provider:   Thomasene Ripple, DO    Adopting a Healthy Lifestyle.  Know what a healthy weight is for you (roughly BMI <25) and aim to maintain this   Aim for 7+ servings of fruits and vegetables daily   65-80+ fluid ounces of water or unsweet tea for healthy kidneys   Limit to max 1 drink of alcohol per day; avoid smoking/tobacco   Limit animal fats in diet for cholesterol and heart health - choose grass fed whenever available   Avoid highly processed foods, and foods high in saturated/trans fats   Aim for low stress - take time to unwind and care for your mental health   Aim for 150 min of moderate intensity exercise weekly for heart health, and weights twice weekly for bone health   Aim for 7-9 hours of sleep daily   When it comes to diets, agreement about the perfect plan isnt easy to find, even among the experts. Experts at the [employer] developed an idea known as the [employer]  Plate. Just imagine a plate divided into logical, healthy portions.   The emphasis is on diet quality:   Load up on vegetables and fruits - one-half of your plate: Aim for color and variety, and remember that potatoes dont count.   Go for whole grains - one-quarter of your plate: Whole wheat, barley, wheat berries, quinoa, oats, brown rice, and foods made with them. If you want pasta, go with whole wheat pasta.   Protein power - one-quarter of your plate: Fish, chicken, beans, and nuts are all healthy, versatile protein sources. Limit red meat.   The diet, however, does go beyond the plate, offering a few other suggestions.   Use healthy plant oils, such as olive, canola, soy, corn, sunflower and peanut. Check the labels, and avoid partially hydrogenated oil, which have unhealthy trans fats.   If youre thirsty, drink water. Coffee and tea are good in moderation, but skip sugary drinks and limit milk  and dairy products to one or two daily servings.   The type of carbohydrate in the diet is more important than the amount. Some sources of carbohydrates, such as vegetables, fruits, whole grains, and beans-are healthier than others.   Finally, stay active  Signed, Thomasene Ripple, DO  08/01/2022 11:21 AM    [facility]

## 2022-08-18 ENCOUNTER — Ambulatory Visit: Payer: Medicare HMO | Admitting: Cardiology

## 2022-08-18 ENCOUNTER — Other Ambulatory Visit: Payer: Self-pay | Admitting: Cardiology

## 2022-08-20 ENCOUNTER — Other Ambulatory Visit: Payer: Self-pay | Admitting: Cardiology

## 2023-01-28 ENCOUNTER — Ambulatory Visit: Payer: Medicare HMO | Attending: Cardiology | Admitting: Cardiology

## 2023-01-28 ENCOUNTER — Encounter: Payer: Self-pay | Admitting: Cardiology

## 2023-01-28 VITALS — BP 122/70 | HR 71 | Ht 70.0 in | Wt 205.0 lb

## 2023-01-28 DIAGNOSIS — I2510 Atherosclerotic heart disease of native coronary artery without angina pectoris: Secondary | ICD-10-CM | POA: Diagnosis not present

## 2023-01-28 DIAGNOSIS — I10 Essential (primary) hypertension: Secondary | ICD-10-CM

## 2023-01-28 DIAGNOSIS — E119 Type 2 diabetes mellitus without complications: Secondary | ICD-10-CM

## 2023-01-28 NOTE — Progress Notes (Unsigned)
Cardiology Office Note:    Date:  01/31/2023   ID:  Ronald Cruz, DOB [birth year], MRN [id number]  PCP:  Tracey Harries, MD  Cardiologist:  Thomasene Ripple, DO  Electrophysiologist:  None   Referring MD: Tracey Harries, MD   " I am doing okay"  History of Present Illness:    Ronald Cruz is a 72 y.o. male with a hx of  coronary artery disease, hypertension, hyperlipidemia, diabetes is here today for follow-up visit.  I last saw the patient on February 06, 2020 at that time he appeared to be doing well from a cardiovascular standpoint.  He had chest completed heart catheterization where he had mild to moderate nonobstructive coronary artery disease.  He had questions about the coronary calcification. During his visit I explained to the patient again noting that there is no clinical benefit to repeat coronary calcium scoring.  We already have had this testing including coronary CTA and a heart catheterization with nonobstructive CAD CAD.  He does not have any anginal symptoms at all.  I explained to him that there is no indication for pursuing any ischemic evaluation with no symptoms suggesting angina or anginal equivalent.  I tried to clarify why coronary calcification is not the test of choice for any repeat evaluation for the patient given his coronary artery disease and given previous testing he is done and knowing at this point that he is asymptomatic.   Past Medical History:  Diagnosis Date   Acute pain of left shoulder 03/30/2017   Allergic rhinitis 08/19/2012   ASCVD (arteriosclerotic cardiovascular disease) 10/24/2019   Cervical spondylosis 07/20/2010   Chronic midline low back pain without sciatica 08/28/2019   Last Assessment & Plan:  Formatting of this note might be different from the original. This pain is chronic, but he does not take medication, nor have any radicular symptoms.  Because he can have multiple diagnostic imaging studies performed on the same day for a single co-pay, he  desires MRI of the lumbar spine to be scheduled along with previously planned MRI of the brain.  MRI and plain films o   Contact dermatitis and eczema due to plant 06/28/2012   Formatting of this note might be different from the original. Overview:  IMPRESSION: Topical itch creams advised also.  Steroid will affect blood sugar of next few days. Formatting of this note might be different from the original. IMPRESSION: Topical itch creams advised also.  Steroid will affect blood sugar of next few days. Formatting of this note might be different from the original. 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 Overview:  IMPRESSION: Pt's Levitra has not worked. We will change to Viagra. Welbutrin usually does not affect erection. It could be more a result of his DM Formatting of this note might be different from the original. IMPRESSION: Pt's Levitra has not worked. We will change to Viagra. Welbutrin usually does not affect erection. It coul   Essential hypertension 08/19/2012   Herpes zoster 05/02/2013   Formatting of this note might be different from the original. Overview:  STORY: rash since sunday, post 72 hours window for antiviral therapy. `E1o3L`pain described is consistant with shingles, the rash is not vesicular, but has been scratching areas.`E1o3L`IMPRESSION: pain bearable. will watch and start neurontin if needed Formatting of this note might be different from the original. STORY: rash  Hyperlipidemia 10/24/2019   Insomnia    Lump in neck 08/28/2019   Formatting of this note might be different from the original. RIGHT, just posterior to the angle of the mandible.  Nontender.  Soft and mobile.  Last Assessment & Plan:  Formatting of this note might be different from the original. Right, just posterior to the angle of the mandible.  Nontender.  Soft and mobile.  This is  not concerning for neoplasm.   Memory changes 08/21/2019   Mixed hyperlipidemia 05/02/2013   Formatting of this note might be different from the original. Overview:  IMPRESSION: The goal is to have your total cholesterol < 200, the HDL (good cholesterol) >40, and the LDL (bad cholesterol) <100.`E1o3L`It is recomended that you follow a good low fat diet and exercise for 30 minutes 3-4 times a week. Formatting of this note might be different from the original. 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 Overview:  IMPRESSION: The goal is for the Hgb A1C to be less than 7.0.`E1o3L`It is recommended that all diatetics are educated on and follow a healthy diabetic diet, exercise for 30 minutes 3-4 times per week (walking, biking, swimming, or machine), monitor blood glucose readings and bring that record with you to be reviewed at your ne   Vitamin D deficiency 08/18/2011   Formatting of this note might be different from the original. Overview:  IMPRESSION: patient taking Vitamin D 2000IU per day. His levels are normal. Continue same dose Formatting of this note might be different from the original. IMPRESSION: patient taking Vitamin D 2000IU per day. His levels are normal. Continue same dose Formatting of this note might be different from the original. Overview:  Ov    Past Surgical  History:  Procedure Laterality Date   HAND SURGERY Right    Thumb   HERNIA REPAIR     LEFT HEART CATH AND CORONARY ANGIOGRAPHY N/A 01/04/2020   Procedure: LEFT HEART CATH AND CORONARY ANGIOGRAPHY;  Surgeon: Kathleene Hazel, MD;  Location: [facility];  Service: Cardiovascular;  Laterality: N/A;    Current Medications: Current Meds  Medication Sig   amLODipine (NORVASC) 10 MG tablet amlodipine 10 mg tablet  TAKE 1 TABLET BY MOUTH ONCE DAILY   Ascorbic Acid (VITAMIN C) 1000 MG tablet Take 1,000 mg by mouth every evening.    aspirin EC 81 MG tablet Take 81 mg by mouth daily. Swallow whole.   benazepril (LOTENSIN) 20 MG tablet Take 20 mg by mouth daily.   cyanocobalamin 1000 MCG tablet Take 1,000 mcg by mouth in the morning and at bedtime.    glipiZIDE (GLUCOTROL XL) 10 MG 24 hr tablet Take 10 mg by mouth in the morning and at bedtime.   ibuprofen (ADVIL) 200 MG tablet Take 200 mg by mouth as needed.   metFORMIN (GLUCOPHAGE) 1000 MG  tablet Take 1,000 mg by mouth 2 (two) times daily with a meal.   naproxen (NAPROSYN) 500 MG tablet Take 500 mg by mouth 2 (two) times daily.   omeprazole (PRILOSEC) 20 MG capsule Take 1 capsule by mouth once daily   Vitamin D, Ergocalciferol, 50 MCG (2000 UT) CAPS Take 2,000 Units by mouth daily.    vitamin E 45 MG (100 UNITS) capsule Take 100 Units by mouth every evening.   WHEAT GERM OIL PO Take 3 capsules by mouth in the morning and at bedtime. 3 capsules in the morning and 3 capsules in the evening.     Allergies:   Patient has no known allergies.   Social History   Socioeconomic History   Marital status: Married    Spouse name: Not on file   Number of children: Not on file   Years of education: Not on file   Highest education level: Not on file  Occupational History   Not on file  Tobacco Use   Smoking status: Never    Passive exposure: Past   Smokeless tobacco: Never  Substance and Sexual Activity   Alcohol use: Yes     Comment: beer once or twice a month   Drug use: Never   Sexual activity: Not on file  Other Topics Concern   Not on file  Social History Narrative   Not on file   Social Determinants of Health   Financial Resource Strain: Low Risk  (01/03/2023)   Received [facility]   Overall Financial Resource Strain (CARDIA)    Difficulty of Paying Living Expenses: Not hard at all  Food Insecurity: No Food Insecurity (01/03/2023)   Received from [facility]   Hunger Vital Sign    Worried About Running Out of Food in the Last Year: Never true    Ran Out of Food in the Last Year: Never true  Transportation Needs: No Transportation Needs (01/03/2023)   Received from [facility] - Transportation    Lack of Transportation (Medical): No    Lack of Transportation (Non-Medical): No  Physical Activity: Sufficiently Active (01/03/2023)   Received from [facility]   Exercise Vital Sign    Days of Exercise per Week: 3 days    Minutes of Exercise per Session: 60 min  Stress: No Stress Concern Present (01/03/2023)   Received from [facility] - Occupational Stress Questionnaire    Feeling of Stress : Not at all  Social Connections: Socially Integrated (01/03/2023)   Received from [facility]   Social Network    How would you rate your social network (family, work, friends)?: Good participation with social networks     Family History: The patient's family history includes Cancer in his paternal grandfather; Diabetes in his father, maternal uncle, maternal uncle, paternal aunt, and paternal uncle; Diverticulosis in his father, sister, and sister; Esophageal cancer in his mother; Irritable bowel syndrome in his father and sister; Pancreatic cancer in his maternal grandfather; Stroke in his father. There is no history of Colon cancer, Stomach cancer, or Rectal cancer.  ROS:   Review of Systems  Constitution: Negative for decreased  appetite, fever and weight gain.  HENT: Negative for congestion, ear discharge, hoarse voice and sore throat.   Eyes: Negative for discharge, redness, vision loss in right eye and visual halos.  Cardiovascular: Negative for chest pain, dyspnea on exertion, leg swelling, orthopnea and palpitations.  Respiratory: Negative for cough, hemoptysis,  shortness of breath and snoring.   Endocrine: Negative for heat intolerance and polyphagia.  Hematologic/Lymphatic: Negative for bleeding problem. Does not bruise/bleed easily.  Skin: Negative for flushing, nail changes, rash and suspicious lesions.  Musculoskeletal: Negative for arthritis, joint pain, muscle cramps, myalgias, neck pain and stiffness.  Gastrointestinal: Negative for abdominal pain, bowel incontinence, diarrhea and excessive appetite.  Genitourinary: Negative for decreased libido, genital sores and incomplete emptying.  Neurological: Negative for brief paralysis, focal weakness, headaches and loss of balance.  Psychiatric/Behavioral: Negative for altered mental status, depression and suicidal ideas.  Allergic/Immunologic: Negative for HIV exposure and persistent infections.    EKGs/Labs/Other Studies Reviewed:    The following studies were reviewed today:   EKG:  None today   January 04, 2020 Left catheterizationProx RCA lesion is 30% stenosed. Ost LM to Mid LM lesion is 20% stenosed. Ost Cx to Prox Cx lesion is 20% stenosed. Dist LM to Prox LAD lesion is 30% stenosed. LPDA lesion is 70% stenosed.   1. Mild distal left main stenosis. This lesion is calcified 2. Mild ostial LAD stenosis. This lesion is calcified 3. Mild ostial Circumflex stenosis. This lesion is calcified. The most distal small obtuse marginal branch has a moderate, eccentric stenosis.  4. The RCA is a small to moderate caliber non-dominant artery with mild plaque in the proximal and mid segments.    Recommendations: Medical management of mild non-obstructive  CAD.     Transthoracic echocardiogram 11/27/2019 IMPRESSIONS   1. Left ventricular ejection fraction, by estimation, is 55 to 60%. The  left ventricle has normal function. The left ventricle has no regional  wall motion abnormalities. Left ventricular diastolic parameters are  consistent with Grade I diastolic  dysfunction (impaired relaxation).   2. Right ventricular systolic function is normal. The right ventricular  size is normal. Tricuspid regurgitation signal is inadequate for assessing  PA pressure.   3. The mitral valve is grossly normal. Trivial mitral valve  regurgitation. No evidence of mitral stenosis.   4. The aortic valve is tricuspid. Aortic valve regurgitation is not  visualized. No aortic stenosis is present.   5. The inferior vena cava is normal in size with greater than 50%  respiratory variability, suggesting right atrial pressure of 3 mmHg.   Conclusion(s)/Recommendation(s): Normal biventricular function without  evidence of hemodynamically significant valvular heart disease.   FINDINGS   Left Ventricle: Left ventricular ejection fraction, by estimation, is 55  to 60%. The left ventricle has normal function. The left ventricle has no  regional wall motion abnormalities. Definity contrast agent was given IV  to delineate the left ventricular   endocardial borders. The left ventricular internal cavity size was normal  in size. There is no left ventricular hypertrophy. Left ventricular  diastolic parameters are consistent with Grade I diastolic dysfunction  (impaired relaxation).   Right Ventricle: The right ventricular size is normal. No increase in  right ventricular wall thickness. Right ventricular systolic function is  normal. Tricuspid regurgitation signal is inadequate for assessing PA  pressure.   Left Atrium: Left atrial size was normal in size.   Right Atrium: Right atrial size was normal in size.   Pericardium: Trivial pericardial effusion is  present.   Mitral Valve: The mitral valve is grossly normal. Trivial mitral valve  regurgitation. No evidence of mitral valve stenosis.   Tricuspid Valve: The tricuspid valve is grossly normal. Tricuspid valve  regurgitation is not demonstrated. No evidence of tricuspid stenosis.   Aortic Valve: The aortic valve  is tricuspid. Aortic valve regurgitation is  not visualized. No aortic stenosis is present.   Pulmonic Valve: The pulmonic valve was grossly normal. Pulmonic valve  regurgitation is trivial. No evidence of pulmonic stenosis.   Aorta: The aortic root and ascending aorta are structurally normal, with  no evidence of dilitation.   Venous: The inferior vena cava is normal in size with greater than 50%  respiratory variability, suggesting right atrial pressure of 3 mmHg.   IAS/Shunts: The atrial septum is grossly normal.     Recent Labs: No results found for requested labs within last 365 days.  Recent Lipid Panel No results found for: "CHOL", "TRIG", "HDL", "CHOLHDL", "VLDL", "LDLCALC", "LDLDIRECT"  Physical Exam:    VS:  BP 122/70 (BP Location: Right Arm, Patient Position: Sitting, Cuff Size: Normal)   Pulse 71   Ht 5\' 10"  (1.778 m)   Wt 205 lb (93 kg)   SpO2 95%   BMI 29.41 kg/m     Wt Readings from Last 3 Encounters:  01/28/23 205 lb (93 kg)  07/28/22 200 lb 8 oz (90.9 kg)  02/12/22 210 lb 12.8 oz (95.6 kg)     GEN: Well nourished, well developed in no acute distress HEENT: Normal NECK: No JVD; No carotid bruits LYMPHATICS: No lymphadenopathy CARDIAC: S1S2 noted,RRR, no murmurs, rubs, gallops RESPIRATORY:  Clear to auscultation without rales, wheezing or rhonchi  ABDOMEN: Soft, non-tender, non-distended, +bowel sounds, no guarding. EXTREMITIES: +2 bilateral lower extremity edema, No cyanosis, no clubbing MUSCULOSKELETAL:  No deformity  SKIN: Warm and dry NEUROLOGIC:  Alert and oriented x 3, non-focal PSYCHIATRIC:  Normal affect, good  insight  ASSESSMENT:    1. Essential hypertension   2. Coronary artery disease involving native coronary artery of native heart without angina pectoris   3. Type 2 diabetes mellitus without complication, without long-term current use of insulin (HCC)     PLAN:    Coronary artery disease- During his visit I explained to the patient again noting that there is no clinical benefit to repeat coronary calcium scoring.  We already have had this testing including coronary CTA and a heart catheterization with nonobstructive CAD.  He does not have any anginal symptoms at all.  I explained to him that there is no indication for pursuing any ischemic evaluation with no symptoms suggesting angina or anginal equivalent.  I tried to clarify why coronary calcification is not the test of choice for any repeat evaluation for the patient given his coronary artery disease and given previous testing he is done and knowing at this point that he is asymptomatic.  So during this visit he is asymptomatic no concern for anginal or anginal equivalent.  He currently is on aspirin and statin we will continue this at this time.  As noted above I explained to the patient that there is no indication for ischemic evaluation at this time.  Also shared with him that if he experience symptoms that could suggest angina we will start him on antianginal in pursuing with functional ischemic evaluation.    Hyperlipidemia - continue with current statin medication. He recently had blood work with his PCP.  Would like that information he has committed to sharing this information with me.  LDL goal less than 55.  Diabetes mellitus type managed by his PCP.  The patient is in agreement with the above plan. The patient left the office in stable condition.  The patient will follow up in 6 months.    Medication Adjustments/Labs and  Tests Ordered: Current medicines are reviewed at length with the patient today.  Concerns regarding medicines  are outlined above.  Orders Placed This Encounter  Procedures   EKG 12-Lead   No orders of the defined types were placed in this encounter.   Patient Instructions  Medication Instructions:  Your physician recommends that you continue on your current medications as directed. Please refer to the Current Medication list given to you today.  *If you need a refill on your cardiac medications before your next appointment, please call your pharmacy*    Follow-Up: At [facility], you and your health needs are our priority.  As part of our continuing mission to provide you with exceptional heart care, we have created designated Provider Care Teams.  These Care Teams include your primary Cardiologist (physician) and Advanced Practice Providers (APPs -  Physician Assistants and Nurse Practitioners) who all work together to provide you with the care you need, when you need it.   Your next appointment:   9 month(s)  Provider:   Thomasene Ripple, DO     Adopting a Healthy Lifestyle.  Know what a healthy weight is for you (roughly BMI <25) and aim to maintain this   Aim for 7+ servings of fruits and vegetables daily   65-80+ fluid ounces of water or unsweet tea for healthy kidneys   Limit to max 1 drink of alcohol per day; avoid smoking/tobacco   Limit animal fats in diet for cholesterol and heart health - choose grass fed whenever available   Avoid highly processed foods, and foods high in saturated/trans fats   Aim for low stress - take time to unwind and care for your mental health   Aim for 150 min of moderate intensity exercise weekly for heart health, and weights twice weekly for bone health   Aim for 7-9 hours of sleep daily   When it comes to diets, agreement about the perfect plan isnt easy to find, even among the experts. Experts at the [facility] of [employer] developed an idea known as the Healthy Eating Plate. Just imagine a plate divided into logical, healthy  portions.   The emphasis is on diet quality:   Load up on vegetables and fruits - one-half of your plate: Aim for color and variety, and remember that potatoes dont count.   Go for whole grains - one-quarter of your plate: Whole wheat, barley, wheat berries, quinoa, oats, brown rice, and foods made with them. If you want pasta, go with whole wheat pasta.   Protein power - one-quarter of your plate: Fish, chicken, beans, and nuts are all healthy, versatile protein sources. Limit red meat.   The diet, however, does go beyond the plate, offering a few other suggestions.   Use healthy plant oils, such as olive, canola, soy, corn, sunflower and peanut. Check the labels, and avoid partially hydrogenated oil, which have unhealthy trans fats.   If youre thirsty, drink water. Coffee and tea are good in moderation, but skip sugary drinks and limit milk and dairy products to one or two daily servings.   The type of carbohydrate in the diet is more important than the amount. Some sources of carbohydrates, such as vegetables, fruits, whole grains, and beans-are healthier than others.   Finally, stay active  Signed, Thomasene Ripple, DO  01/31/2023 7:47 PM    [facility]

## 2023-01-28 NOTE — Patient Instructions (Signed)
Medication Instructions:  Your physician recommends that you continue on your current medications as directed. Please refer to the Current Medication list given to you today.  *If you need a refill on your cardiac medications before your next appointment, please call your pharmacy*   Follow-Up: At [facility], you and your health needs are our priority.  As part of our continuing mission to provide you with exceptional heart care, we have created designated Provider Care Teams.  These Care Teams include your primary Cardiologist (physician) and Advanced Practice Providers (APPs -  Physician Assistants and Nurse Practitioners) who all work together to provide you with the care you need, when you need it.  Your next appointment:   9 month(s)  Provider:   Thomasene Ripple, DO

## 2023-08-28 ENCOUNTER — Other Ambulatory Visit: Payer: Self-pay | Admitting: Cardiology

## 2023-08-30 ENCOUNTER — Other Ambulatory Visit: Payer: Self-pay

## 2023-08-30 MED ORDER — FUROSEMIDE 20 MG PO TABS: 20.0000 mg | ORAL_TABLET | ORAL | 2 refills | Status: AC
